# Patient Record
Sex: Male | Born: 1970
Health system: Southern US, Community
[De-identification: ages and names within clinical notes are randomized; demographics above are authoritative.]

## PROBLEM LIST (undated history)

## (undated) DIAGNOSIS — J301 Allergic rhinitis due to pollen: Secondary | ICD-10-CM

## (undated) DIAGNOSIS — L732 Hidradenitis suppurativa: Secondary | ICD-10-CM

## (undated) HISTORY — DX: Hidradenitis suppurativa: L73.2

## (undated) HISTORY — PX: NO PAST SURGERIES: SHX2092

## (undated) HISTORY — DX: Allergic rhinitis due to pollen: J30.1

---

## 1998-04-05 ENCOUNTER — Encounter: Payer: Self-pay | Admitting: Emergency Medicine

## 1998-04-05 ENCOUNTER — Emergency Department (HOSPITAL_COMMUNITY): Admission: EM | Admit: 1998-04-05 | Discharge: 1998-04-05 | Payer: Self-pay | Admitting: Emergency Medicine

## 2008-09-25 ENCOUNTER — Emergency Department (HOSPITAL_COMMUNITY): Admission: EM | Admit: 2008-09-25 | Discharge: 2008-09-26 | Payer: Self-pay | Admitting: Emergency Medicine

## 2012-10-04 ENCOUNTER — Emergency Department: Payer: Self-pay | Admitting: Emergency Medicine

## 2012-10-04 LAB — BASIC METABOLIC PANEL
Anion Gap: 3 — ABNORMAL LOW (ref 7–16)
BUN: 12 mg/dL (ref 7–18)
Calcium, Total: 9.2 mg/dL (ref 8.5–10.1)
Chloride: 105 mmol/L (ref 98–107)
Co2: 31 mmol/L (ref 21–32)
Creatinine: 0.93 mg/dL (ref 0.60–1.30)
EGFR (African American): >60
EGFR (Non-African Amer.): >60
Glucose: 87 mg/dL (ref 65–99)
Osmolality: 277 (ref 275–301)
Potassium: 4.3 mmol/L (ref 3.5–5.1)
Sodium: 139 mmol/L (ref 136–145)

## 2012-10-04 LAB — CBC WITH DIFFERENTIAL/PLATELET
Basophil #: 0.1 10*3/uL (ref 0.0–0.1)
Basophil %: 0.5 %
Eosinophil #: 0.1 10*3/uL (ref 0.0–0.7)
Eosinophil %: 1.1 %
HCT: 43.8 % (ref 40.0–52.0)
HGB: 14.1 g/dL (ref 13.0–18.0)
Lymphocyte #: 1.4 10*3/uL (ref 1.0–3.6)
Lymphocyte %: 10.4 %
MCH: 28.1 pg (ref 26.0–34.0)
MCHC: 32.2 g/dL (ref 32.0–36.0)
MCV: 87 fL (ref 80–100)
Monocyte #: 1.5 x10 3/mm — ABNORMAL HIGH (ref 0.2–1.0)
Monocyte %: 10.7 %
Neutrophil #: 10.7 10*3/uL — ABNORMAL HIGH (ref 1.4–6.5)
Neutrophil %: 77.3 %
Platelet: 335 10*3/uL (ref 150–440)
RBC: 5.01 10*6/uL (ref 4.40–5.90)
RDW: 14.9 % — ABNORMAL HIGH (ref 11.5–14.5)
WBC: 13.8 10*3/uL — ABNORMAL HIGH (ref 3.8–10.6)

## 2012-10-07 ENCOUNTER — Emergency Department: Payer: Self-pay

## 2012-11-19 ENCOUNTER — Emergency Department: Payer: Self-pay | Admitting: Emergency Medicine

## 2013-01-17 DIAGNOSIS — L732 Hidradenitis suppurativa: Secondary | ICD-10-CM

## 2015-11-05 DIAGNOSIS — F17200 Nicotine dependence, unspecified, uncomplicated: Secondary | ICD-10-CM | POA: Insufficient documentation

## 2015-11-14 ENCOUNTER — Telehealth: Payer: Self-pay

## 2015-11-14 NOTE — Telephone Encounter (Signed)
Contacted patient to see if he was still interested in scheduling a New Patient appointment with [facility] as he wasn't at the time he turned in his application and eligibility.  Attempted to call patient previously, but no luck reaching.   Was successful today and per patient, he has already established care at another practice so an appointment was no longer needed.Marland Kitchen.  He asked that we hold onto his application in [facility].  I advised him that we would hold onto it for 6 months before it would be shredded.

## 2016-05-21 ENCOUNTER — Emergency Department
Admission: EM | Admit: 2016-05-21 | Discharge: 2016-05-21 | Disposition: A | Discharge status: Home / Self Care | Payer: Self-pay | Attending: Emergency Medicine | Admitting: Emergency Medicine

## 2016-05-21 DIAGNOSIS — B349 Viral infection, unspecified: Secondary | ICD-10-CM | POA: Insufficient documentation

## 2016-05-21 DIAGNOSIS — F17200 Nicotine dependence, unspecified, uncomplicated: Secondary | ICD-10-CM | POA: Insufficient documentation

## 2016-05-21 DIAGNOSIS — L732 Hidradenitis suppurativa: Secondary | ICD-10-CM

## 2016-05-21 MED ORDER — ONDANSETRON 4 MG PO TBDP
4.0000 mg | ORAL_TABLET | Freq: Once | ORAL | Status: AC
  Administered 2016-05-21: 4 mg via ORAL
  Filled 2016-05-21: qty 1

## 2016-05-21 MED ORDER — ONDANSETRON 4 MG PO TBDP: 4.0000 mg | ORAL_TABLET | Freq: Three times a day (TID) | ORAL | 0 refills | Status: DC | PRN

## 2016-05-21 NOTE — ED Notes (Signed)
See triage note  States he developed headache on Monday evening  Then developed some body aches with some "hot flashes and cold chills" afebrile on arrival  Having some nausea today had 1 episode of vomiting

## 2016-05-21 NOTE — ED Notes (Signed)
Pt given a glass of water for PO challange

## 2016-05-21 NOTE — ED Provider Notes (Signed)
[facility] Emergency Department Provider Note  ____________________________________________  Time seen: Approximately 12:03 PM  I have reviewed the triage vital signs and the nursing notes.   HISTORY  Chief Complaint Nausea and Weakness   HPI Author L Mayford Knifeurner is a 46 y.o. male who presents to the emergency department for evalution of headache, pain, nausea, and a single episode of vomiting last night. Symptoms started yesterday. He states that he is also had some intermittent dizziness and intermittent abdominal pain. He has been able to tolerate ginger ale without vomiting, but has not tolerated any other fluids or foods. He denies fevers, cough, chills, congestion, or diarrhea. He is taking Humira for chronic hidradenitis and his injection is due today, however he has not taken it due to.    No past medical history on file.  Patient Active Problem List   Diagnosis Date Noted  . Hidradenitis suppurativa 05/21/2016    No past surgical history on file.  Prior to Admission medications   Medication Sig Start Date End Date Taking? Authorizing Provider  ondansetron (ZOFRAN-ODT) 4 MG disintegrating tablet Take 1 tablet (4 mg total) by mouth every 8 (eight) hours as needed for nausea or vomiting. 05/21/16   Chinita Pesterari B Elaiza Shoberg, FNP    Allergies Patient has no known allergies.  No family history on file.  Social History Social History  Substance Use Topics  . Smoking status: Current Every Day Smoker  . Smokeless tobacco: Never Used  . Alcohol use Yes    Review of Systems Constitutional: Negative for fever/chills ENT: Negative for sore throat. Cardiovascular: Denies chest pain. Respiratory: Negative for shortness of breath. Negative for cough. Gastrointestinal: Positive for nausea,  positive for one episode of vomiting.  No diarrhea.  Musculoskeletal: Negative for body aches Skin: Negative for rash. Neurological: Negative for  headaches ____________________________________________   PHYSICAL EXAM:  VITAL SIGNS: ED Triage Vitals  Enc Vitals Group     BP 05/21/16 1001 100/72     Pulse Rate 05/21/16 1001 98     Resp 05/21/16 1001 18     Temp 05/21/16 1001 98.6 F (37 C)     Temp Source 05/21/16 1001 Oral     SpO2 05/21/16 1001 99 %     Weight 05/21/16 1001 175 lb (79.4 kg)     Height 05/21/16 1001 6\' 1"  (1.854 m)     Head Circumference --      Peak Flow --      Pain Score 05/21/16 1005 8     Pain Loc --      Pain Edu? --      Excl. in GC? --     Constitutional: Alert and oriented. Well appearing and in no acute distress. Eyes: Conjunctivae are normal. EOMI. Nose: No sinus pain on exam, no rhinorrhea. Mouth/Throat: Mucous membranes are moist.  Oropharynx is without erythema and tonsils were not visualized. Neck: No stridor. Airway is patent Lymphatic: No palpable cervical lymphadenopathy  Cardiovascular: Normal rate, regular rhythm. Good peripheral circulation. Respiratory: Breath sounds clear to auscultation throughout. Normal respiratory effort without retractions. Gastrointestinal: Soft, nontender to palpation, no rebound or guarding. Bowel sounds hyperactive 4 quadrants. Musculoskeletal: FROM x 4 extremities.  Neurologic:  Normal speech and language.  Skin:  Skin is warm, dry and intact. No rash noted. Psychiatric: Mood and affect are normal. Speech and behavior are normal.  ____________________________________________   LABS (all labs ordered are listed, but only abnormal results are displayed)  Labs Reviewed - No data  to display ____________________________________________  EKG   ____________________________________________  RADIOLOGY   ____________________________________________   PROCEDURES  Procedure(s) performed: None  Critical Care performed: No ____________________________________________   INITIAL IMPRESSION / ASSESSMENT AND PLAN / ED COURSE  46 year old male  presenting to the emergency department for decreased appetite, 1 episode of vomiting, headache, nausea, and occasional dizziness. While in the emergency department he was given one dose of Zofran followed by a by mouth challenge. He was able to tolerate fluids and complains of feeling hungry. He was advised to withhold his dose of Humira today and give next week after his current symptoms have resolved. He was encouraged to stay on clear liquids today and then increase to bland foods as tolerated tomorrow. He was advised to follow-up with his primary care provider for any symptom that is not improving over the next 2-3 days. He was instructed to return to the emergency department for symptoms that change or worsen if he is unable schedule an appointment.  Pertinent labs & imaging results that were available during my care of the patient were reviewed by me and considered in my medical decision making (see chart for details).  Discharge Medication List as of 05/21/2016  1:11 PM    START taking these medications   Details  ondansetron (ZOFRAN-ODT) 4 MG disintegrating tablet Take 1 tablet (4 mg total) by mouth every 8 (eight) hours as needed for nausea or vomiting., Starting Wed 05/21/2016, Print        If controlled substance prescribed during this visit, 12 month history viewed on the [facility] prior to issuing an initial prescription for Schedule II or III opiod. ____________________________________________   FINAL CLINICAL IMPRESSION(S) / ED DIAGNOSES  Final diagnoses:  Viral illness    Note:  This document was prepared using Dragon voice recognition software and may include unintentional dictation errors.     Chinita Pester, FNP 05/21/16 1454    Emily Filbert, MD 05/21/16 [phone]

## 2016-05-21 NOTE — ED Triage Notes (Signed)
Pt arrives ambulatory to triage with reports of headache pain, nausea with one episode of vomiting in the past 24 hours and dizziness   Symptoms began approx 2 days ago

## 2017-02-09 DIAGNOSIS — L732 Hidradenitis suppurativa: Secondary | ICD-10-CM | POA: Diagnosis not present

## 2017-02-09 DIAGNOSIS — Z6821 Body mass index (BMI) 21.0-21.9, adult: Secondary | ICD-10-CM | POA: Diagnosis not present

## 2017-04-15 DIAGNOSIS — N5089 Other specified disorders of the male genital organs: Secondary | ICD-10-CM | POA: Diagnosis not present

## 2017-04-15 DIAGNOSIS — R05 Cough: Secondary | ICD-10-CM | POA: Diagnosis not present

## 2017-04-15 DIAGNOSIS — L732 Hidradenitis suppurativa: Secondary | ICD-10-CM | POA: Diagnosis not present

## 2017-04-15 DIAGNOSIS — Z5181 Encounter for therapeutic drug level monitoring: Secondary | ICD-10-CM | POA: Diagnosis not present

## 2017-04-15 DIAGNOSIS — Z79899 Other long term (current) drug therapy: Secondary | ICD-10-CM | POA: Diagnosis not present

## 2017-04-26 ENCOUNTER — Other Ambulatory Visit: Payer: Self-pay

## 2017-04-26 ENCOUNTER — Emergency Department
Admission: EM | Admit: 2017-04-26 | Discharge: 2017-04-27 | Disposition: A | Discharge status: Home / Self Care | Payer: BLUE CROSS/BLUE SHIELD | Attending: Emergency Medicine | Admitting: Emergency Medicine

## 2017-04-26 ENCOUNTER — Emergency Department: Payer: BLUE CROSS/BLUE SHIELD

## 2017-04-26 DIAGNOSIS — Z79899 Other long term (current) drug therapy: Secondary | ICD-10-CM | POA: Diagnosis not present

## 2017-04-26 DIAGNOSIS — R110 Nausea: Secondary | ICD-10-CM | POA: Diagnosis not present

## 2017-04-26 DIAGNOSIS — R05 Cough: Secondary | ICD-10-CM

## 2017-04-26 DIAGNOSIS — M791 Myalgia, unspecified site: Secondary | ICD-10-CM | POA: Diagnosis not present

## 2017-04-26 DIAGNOSIS — J209 Acute bronchitis, unspecified: Secondary | ICD-10-CM | POA: Diagnosis not present

## 2017-04-26 DIAGNOSIS — R059 Cough, unspecified: Secondary | ICD-10-CM

## 2017-04-26 DIAGNOSIS — F17200 Nicotine dependence, unspecified, uncomplicated: Secondary | ICD-10-CM | POA: Diagnosis not present

## 2017-04-26 NOTE — ED Notes (Signed)
Pt reports bad cough for the last 2 weeks, yellowish thick production with cough, pt within the last week began to take Remicade for suppression of hidradenitis suppurativa (pt reports regular boils in groin), pt told by doctor that this is an immuno suppressant and to come to ED if sick  Reports taking OTC meds: "mucus relief/expectorant" without relief

## 2017-04-26 NOTE — ED Notes (Signed)
Pt given a mask and instructed on mask wearing. Pt demonstrates understanding.

## 2017-04-26 NOTE — ED Triage Notes (Signed)
Pt states has had a cough for over one week. Pt states he has had a sore throat, body aches. Pt is taking remicade. Pt denies otc medication. Strong cough noted with yellow sputum production.

## 2017-04-27 ENCOUNTER — Other Ambulatory Visit: Payer: Self-pay

## 2017-04-27 LAB — CBC WITH DIFFERENTIAL/PLATELET
BASOS ABS: 0 10*3/uL (ref 0–0.1)
BASOS PCT: 0 %
EOS ABS: 0 10*3/uL (ref 0–0.7)
Eosinophils Relative: 0 %
HCT: 38.9 % — ABNORMAL LOW (ref 40.0–52.0)
Hemoglobin: 12.9 g/dL — ABNORMAL LOW (ref 13.0–18.0)
LYMPHS PCT: 16 %
Lymphs Abs: 2.9 10*3/uL (ref 1.0–3.6)
MCH: 28.8 pg (ref 26.0–34.0)
MCHC: 33.2 g/dL (ref 32.0–36.0)
MCV: 86.7 fL (ref 80.0–100.0)
MONO ABS: 2.4 10*3/uL — AB (ref 0.2–1.0)
Monocytes Relative: 13 %
NEUTROS PCT: 71 %
Neutro Abs: 13.1 10*3/uL — ABNORMAL HIGH (ref 1.4–6.5)
PLATELETS: 318 10*3/uL (ref 150–440)
RBC: 4.49 MIL/uL (ref 4.40–5.90)
RDW: 15.4 % — ABNORMAL HIGH (ref 11.5–14.5)
WBC: 18.4 10*3/uL — ABNORMAL HIGH (ref 3.8–10.6)

## 2017-04-27 LAB — LACTIC ACID, PLASMA: Lactic Acid, Venous: 0.6 mmol/L (ref 0.5–1.9)

## 2017-04-27 LAB — BASIC METABOLIC PANEL
Anion gap: 11 (ref 5–15)
BUN: 9 mg/dL (ref 6–20)
CO2: 27 mmol/L (ref 22–32)
CREATININE: 0.87 mg/dL (ref 0.61–1.24)
Calcium: 8.3 mg/dL — ABNORMAL LOW (ref 8.9–10.3)
Chloride: 101 mmol/L (ref 101–111)
Glucose, Bld: 97 mg/dL (ref 65–99)
Potassium: 3 mmol/L — ABNORMAL LOW (ref 3.5–5.1)
SODIUM: 139 mmol/L (ref 135–145)

## 2017-04-27 LAB — GROUP A STREP BY PCR: GROUP A STREP BY PCR: NOT DETECTED

## 2017-04-27 MED ORDER — DIPHENHYDRAMINE HCL 50 MG/ML IJ SOLN
INTRAMUSCULAR | Status: AC
  Administered 2017-04-27: 25 mg via INTRAVENOUS
  Filled 2017-04-27: qty 1

## 2017-04-27 MED ORDER — HYDROCOD POLST-CPM POLST ER 10-8 MG/5ML PO SUER: 5.0000 mL | Freq: Two times a day (BID) | ORAL | 0 refills | Status: DC

## 2017-04-27 MED ORDER — AMOXICILLIN 500 MG PO CAPS: 500.0000 mg | ORAL_CAPSULE | Freq: Three times a day (TID) | ORAL | 0 refills | Status: DC

## 2017-04-27 MED ORDER — AZITHROMYCIN 500 MG IV SOLR
500.0000 mg | Freq: Once | INTRAVENOUS | Status: AC
  Administered 2017-04-27: 500 mg via INTRAVENOUS
  Filled 2017-04-27: qty 500

## 2017-04-27 MED ORDER — ONDANSETRON HCL 4 MG/2ML IJ SOLN
4.0000 mg | Freq: Once | INTRAMUSCULAR | Status: AC
Start: 2017-04-27 — End: 2017-04-27
  Administered 2017-04-27: 4 mg via INTRAVENOUS
  Filled 2017-04-27: qty 2

## 2017-04-27 MED ORDER — DEXTROSE 5 % IV SOLN
1.0000 g | Freq: Once | INTRAVENOUS | Status: AC
  Administered 2017-04-27: 1 g via INTRAVENOUS
  Filled 2017-04-27: qty 10

## 2017-04-27 MED ORDER — KETOROLAC TROMETHAMINE 30 MG/ML IJ SOLN
10.0000 mg | Freq: Once | INTRAMUSCULAR | Status: AC
  Administered 2017-04-27: 9.9 mg via INTRAVENOUS
  Filled 2017-04-27: qty 1

## 2017-04-27 MED ORDER — DIPHENHYDRAMINE HCL 50 MG/ML IJ SOLN
25.0000 mg | Freq: Once | INTRAMUSCULAR | Status: AC
  Administered 2017-04-27: 25 mg via INTRAVENOUS

## 2017-04-27 MED ORDER — SODIUM CHLORIDE 0.9 % IV BOLUS (SEPSIS)
1000.0000 mL | Freq: Once | INTRAVENOUS | Status: AC
  Administered 2017-04-27: 1000 mL via INTRAVENOUS

## 2017-04-27 NOTE — ED Provider Notes (Signed)
[facility] Emergency Department Provider Note   ____________________________________________   First MD Initiated Contact with Patient 04/26/17 2359     (approximate)  I have reviewed the triage vital signs and the nursing notes.   HISTORY  Chief Complaint Cough    HPI Benjamin Owens is a 47 y.o. male who presents to the ED from home with a chief complaint of cough, sore throat, body aches.  Reports symptoms for almost 2 weeks.  Complains of cough productive of yellow sputum, sore throat, body aches and nausea.  Symptoms worse today.. Patient started Remicade infusion 1/28 for hidradenitis suppurativa. He was referred to the ED for evaluation. Denies fever, chills, chest pain, shortness of breath, abdominal pain, vomiting, dysuria, diarrhea. Denies recent travel or trauma.   Past medical history Hidradenitis suppurativa  Patient Active Problem List   Diagnosis Date Noted  . Hidradenitis suppurativa 05/21/2016      Prior to Admission medications   Medication Sig Start Date End Date Taking? Authorizing Provider  amoxicillin (AMOXIL) 500 MG capsule Take 1 capsule (500 mg total) by mouth 3 (three) times daily. 04/27/17   Irean Hong, MD  chlorpheniramine-HYDROcodone [facility] PENNKINETIC ER) 10-8 MG/5ML SUER Take 5 mLs by mouth 2 (two) times daily. 04/27/17   Irean Hong, MD  ondansetron (ZOFRAN-ODT) 4 MG disintegrating tablet Take 1 tablet (4 mg total) by mouth every 8 (eight) hours as needed for nausea or vomiting. 05/21/16   Kem Boroughs B, FNP    Allergies Azithromycin  No family history on file.  Social History Social History   Tobacco Use  . Smoking status: Current Every Day Smoker  . Smokeless tobacco: Never Used  Substance Use Topics  . Alcohol use: Yes  . Drug use: Not on file    Review of Systems  Constitutional: Positive for myalgias.  No fever/chills. Eyes: No visual changes. ENT: Positive for sore  throat. Cardiovascular: Denies chest pain. Respiratory: Positive for productive cough.  Denies shortness of breath. Gastrointestinal: No abdominal pain.  Positive for nausea, no vomiting.  No diarrhea.  No constipation. Genitourinary: Negative for dysuria. Musculoskeletal: Negative for back pain. Skin: Negative for rash. Neurological: Negative for headaches, focal weakness or numbness.   ____________________________________________   PHYSICAL EXAM:  VITAL SIGNS: ED Triage Vitals [04/26/17 2213]  Enc Vitals Group     BP (!) 144/95     Pulse Rate 90     Resp 16     Temp 98.4 F (36.9 C)     Temp Source Oral     SpO2 95 %     Weight 176 lb (79.8 kg)     Height 6' (1.829 m)     Head Circumference      Peak Flow      Pain Score 7     Pain Loc      Pain Edu?      Excl. in GC?     Constitutional: Alert and oriented. Well appearing and in no acute distress. Eyes: Conjunctivae are normal. PERRL. EOMI. Head: Atraumatic. Nose: No congestion/rhinnorhea. Mouth/Throat: Mucous membranes are moist.  Oropharynx mildly erythematous without tonsillar swelling, exudates or peritonsillar abscess.  There is no hoarse or muffled voice.  There is no drooling. Neck: No stridor.  Supple neck without meningismus. Cardiovascular: Normal rate, regular rhythm. Grossly normal heart sounds.  Good peripheral circulation. Respiratory: Normal respiratory effort.  No retractions. Lungs CTAB. Gastrointestinal: Soft and nontender. No distention. No abdominal bruits. No CVA tenderness.  Musculoskeletal: No lower extremity tenderness nor edema.  No joint effusions. Neurologic:  Normal speech and language. No gross focal neurologic deficits are appreciated. No gait instability. Skin:  Skin is warm, dry and intact. No rash noted.  No petechiae. Psychiatric: Mood and affect are normal. Speech and behavior are normal.  ____________________________________________   LABS (all labs ordered are listed, but only  abnormal results are displayed)  Labs Reviewed  CBC WITH DIFFERENTIAL/PLATELET - Abnormal; Notable for the following components:      Result Value   WBC 18.4 (*)    Hemoglobin 12.9 (*)    HCT 38.9 (*)    RDW 15.4 (*)    Neutro Abs 13.1 (*)    Monocytes Absolute 2.4 (*)    All other components within normal limits  BASIC METABOLIC PANEL - Abnormal; Notable for the following components:   Potassium 3.0 (*)    Calcium 8.3 (*)    All other components within normal limits  GROUP A STREP BY PCR  CULTURE, BLOOD (ROUTINE X 2)  CULTURE, BLOOD (ROUTINE X 2)  LACTIC ACID, PLASMA   ____________________________________________  EKG  None ____________________________________________  RADIOLOGY  ED MD interpretation: No pneumonia  Official radiology report(s): Dg Chest 2 View  Result Date: 04/26/2017 CLINICAL DATA:  Acute onset of productive cough. Sore throat and body aches. EXAM: CHEST  2 VIEW COMPARISON:  None. FINDINGS: The lungs are well-aerated and clear. There is no evidence of focal opacification, pleural effusion or pneumothorax. The heart is normal in size; ventricular calcification is suggested on the lateral view. No acute osseous abnormalities are seen. An apparent right-sided nipple shadow is noted. IMPRESSION: No acute cardiopulmonary process seen. Electronically Signed   By: Roanna Raider M.D.   On: 04/26/2017 23:02    ____________________________________________   PROCEDURES  Procedure(s) performed: None  Procedures  Critical Care performed: No  ____________________________________________   INITIAL IMPRESSION / ASSESSMENT AND PLAN / ED COURSE  As part of my medical decision making, I reviewed the following data within the electronic MEDICAL RECORD NUMBER Nursing notes reviewed and incorporated, Labs reviewed, Old chart reviewed, Radiograph reviewed  and Notes from prior ED visits.   47 year old male on IV Remicade infusions who presents with flulike symptoms  for the past 2 weeks. Differential includes, but is not limited to, viral syndrome, bronchitis including COPD exacerbation, pneumonia, reactive airway disease including asthma, CHF including exacerbation with or without pulmonary/interstitial edema, pneumothorax, ACS, thoracic trauma, and pulmonary embolism.  Patient is afebrile and generally well-appearing.  Symptoms for 2 weeks; would not qualify for Tamiflu.  Will check basic lab work, initiate IV fluid resuscitation, Toradol and reassess.  Clinical Course as of Apr 27 714  [facility] Apr 27, 2017  1610 Patient sleeping in no acute distress.  He had some itching associated with azithromycin.  This was stopped and Benadryl given.  No further itching.  Will discharge home on Amoxicillin, Tussionex to use as needed for cough.  Strict return precautions given.  Patient verbalizes understanding and agrees with plan of care.  [JS]  0705 Discharge was held as patient drove himself to the ED and received IV Benadryl.  He will be discharged once his medication hold is completed.  [JS]    Clinical Course User Index [JS] Irean Hong, MD     ____________________________________________   FINAL CLINICAL IMPRESSION(S) / ED DIAGNOSES  Final diagnoses:  Cough  Acute bronchitis, unspecified organism     ED Discharge Orders  Ordered    amoxicillin (AMOXIL) 500 MG capsule  3 times daily     04/27/17 0448    chlorpheniramine-HYDROcodone (TUSSIONEX PENNKINETIC ER) 10-8 MG/5ML SUER  2 times daily     04/27/17 0448       Note:  This document was prepared using Dragon voice recognition software and may include unintentional dictation errors.    Irean HongSung, Lakina Mcintire J, MD 04/27/17 [phone]

## 2017-04-27 NOTE — Discharge Instructions (Signed)
1.  Take antibiotic as prescribed (Amoxicillin 500 mg 3 times daily for 7 days). 2.  You may take Tussionex as needed for cough. 3.  Return to the ER for worsening symptoms, persistent vomiting, difficulty breathing or other concerns.

## 2017-04-27 NOTE — ED Notes (Signed)
Pt states he is feeling better and ready to go home. EDP aware.

## 2017-04-27 NOTE — ED Notes (Signed)
Pt reports itching up right arm, arm appears unremarkable,  ABX stopped and Dr Dolores FrameSung notified, orders received

## 2017-05-02 LAB — CULTURE, BLOOD (ROUTINE X 2)
CULTURE: NO GROWTH
Culture: NO GROWTH
Special Requests: ADEQUATE
Special Requests: ADEQUATE

## 2017-05-18 DIAGNOSIS — L732 Hidradenitis suppurativa: Secondary | ICD-10-CM | POA: Diagnosis not present

## 2017-05-20 DIAGNOSIS — L732 Hidradenitis suppurativa: Secondary | ICD-10-CM | POA: Diagnosis not present

## 2017-06-17 DIAGNOSIS — L732 Hidradenitis suppurativa: Secondary | ICD-10-CM | POA: Diagnosis not present

## 2017-08-06 DIAGNOSIS — L732 Hidradenitis suppurativa: Secondary | ICD-10-CM | POA: Diagnosis not present

## 2017-08-12 DIAGNOSIS — L732 Hidradenitis suppurativa: Secondary | ICD-10-CM | POA: Diagnosis not present

## 2017-09-16 DIAGNOSIS — L732 Hidradenitis suppurativa: Secondary | ICD-10-CM | POA: Diagnosis not present

## 2017-10-07 DIAGNOSIS — L732 Hidradenitis suppurativa: Secondary | ICD-10-CM | POA: Diagnosis not present

## 2017-10-22 DIAGNOSIS — L732 Hidradenitis suppurativa: Secondary | ICD-10-CM | POA: Diagnosis not present

## 2017-11-10 DIAGNOSIS — L732 Hidradenitis suppurativa: Secondary | ICD-10-CM | POA: Diagnosis not present

## 2017-11-10 DIAGNOSIS — Z6822 Body mass index (BMI) 22.0-22.9, adult: Secondary | ICD-10-CM | POA: Diagnosis not present

## 2017-11-24 ENCOUNTER — Other Ambulatory Visit: Payer: Self-pay

## 2017-11-24 ENCOUNTER — Emergency Department: Payer: BLUE CROSS/BLUE SHIELD

## 2017-11-24 ENCOUNTER — Emergency Department
Admission: EM | Admit: 2017-11-24 | Discharge: 2017-11-25 | Disposition: A | Discharge status: Home / Self Care | Payer: BLUE CROSS/BLUE SHIELD | Attending: Emergency Medicine | Admitting: Emergency Medicine

## 2017-11-24 ENCOUNTER — Encounter: Payer: Self-pay | Admitting: Emergency Medicine

## 2017-11-24 DIAGNOSIS — R05 Cough: Secondary | ICD-10-CM | POA: Diagnosis not present

## 2017-11-24 DIAGNOSIS — R1030 Lower abdominal pain, unspecified: Secondary | ICD-10-CM | POA: Diagnosis not present

## 2017-11-24 DIAGNOSIS — F17200 Nicotine dependence, unspecified, uncomplicated: Secondary | ICD-10-CM | POA: Diagnosis not present

## 2017-11-24 DIAGNOSIS — R945 Abnormal results of liver function studies: Secondary | ICD-10-CM

## 2017-11-24 DIAGNOSIS — N289 Disorder of kidney and ureter, unspecified: Secondary | ICD-10-CM | POA: Diagnosis not present

## 2017-11-24 DIAGNOSIS — Z79899 Other long term (current) drug therapy: Secondary | ICD-10-CM | POA: Diagnosis not present

## 2017-11-24 DIAGNOSIS — R52 Pain, unspecified: Secondary | ICD-10-CM

## 2017-11-24 DIAGNOSIS — E876 Hypokalemia: Secondary | ICD-10-CM | POA: Diagnosis not present

## 2017-11-24 DIAGNOSIS — R7989 Other specified abnormal findings of blood chemistry: Secondary | ICD-10-CM

## 2017-11-24 DIAGNOSIS — R0602 Shortness of breath: Secondary | ICD-10-CM | POA: Diagnosis not present

## 2017-11-24 DIAGNOSIS — R51 Headache: Secondary | ICD-10-CM | POA: Diagnosis not present

## 2017-11-24 LAB — COMPREHENSIVE METABOLIC PANEL
ALK PHOS: 613 U/L — AB (ref 38–126)
ALT: 72 U/L — AB (ref 0–44)
AST: 60 U/L — ABNORMAL HIGH (ref 15–41)
Albumin: 2.6 g/dL — ABNORMAL LOW (ref 3.5–5.0)
Anion gap: 11 (ref 5–15)
BUN: 7 mg/dL (ref 6–20)
CALCIUM: 8.4 mg/dL — AB (ref 8.9–10.3)
CO2: 25 mmol/L (ref 22–32)
CREATININE: 0.91 mg/dL (ref 0.61–1.24)
Chloride: 97 mmol/L — ABNORMAL LOW (ref 98–111)
GFR calc Af Amer: >60 mL/min (ref >60)
GFR calc non Af Amer: >60 mL/min (ref >60)
Glucose, Bld: 93 mg/dL (ref 70–99)
Potassium: 3.1 mmol/L — ABNORMAL LOW (ref 3.5–5.1)
SODIUM: 133 mmol/L — AB (ref 135–145)
Total Bilirubin: 3.7 mg/dL — ABNORMAL HIGH (ref 0.3–1.2)
Total Protein: 9.2 g/dL — ABNORMAL HIGH (ref 6.5–8.1)

## 2017-11-24 LAB — CBC WITH DIFFERENTIAL/PLATELET
BASOS PCT: 0 %
Basophils Absolute: 0 10*3/uL (ref 0–0.1)
EOS ABS: 0.2 10*3/uL (ref 0–0.7)
EOS PCT: 1 %
HCT: 38.8 % — ABNORMAL LOW (ref 40.0–52.0)
HEMOGLOBIN: 13.3 g/dL (ref 13.0–18.0)
Lymphocytes Relative: 19 %
Lymphs Abs: 2.2 10*3/uL (ref 1.0–3.6)
MCH: 28.5 pg (ref 26.0–34.0)
MCHC: 34.4 g/dL (ref 32.0–36.0)
MCV: 82.8 fL (ref 80.0–100.0)
MONO ABS: 1.8 10*3/uL — AB (ref 0.2–1.0)
MONOS PCT: 16 %
NEUTROS PCT: 64 %
Neutro Abs: 7.3 10*3/uL — ABNORMAL HIGH (ref 1.4–6.5)
PLATELETS: 329 10*3/uL (ref 150–440)
RBC: 4.68 MIL/uL (ref 4.40–5.90)
RDW: 17.9 % — AB (ref 11.5–14.5)
WBC: 11.6 10*3/uL — ABNORMAL HIGH (ref 3.8–10.6)

## 2017-11-24 LAB — LIPASE, BLOOD: LIPASE: 28 U/L (ref 11–51)

## 2017-11-24 MED ORDER — BUTALBITAL-APAP-CAFFEINE 50-325-40 MG PO TABS
1.0000 | ORAL_TABLET | Freq: Once | ORAL | Status: AC
Start: 2017-11-24 — End: 2017-11-24
  Administered 2017-11-24: 1 via ORAL
  Filled 2017-11-24: qty 1

## 2017-11-24 MED ORDER — IOPAMIDOL (ISOVUE-300) INJECTION 61%
100.0000 mL | Freq: Once | INTRAVENOUS | Status: AC | PRN
  Administered 2017-11-24: 100 mL via INTRAVENOUS

## 2017-11-24 MED ORDER — IBUPROFEN 800 MG PO TABS
800.0000 mg | ORAL_TABLET | Freq: Once | ORAL | Status: AC
  Administered 2017-11-24: 800 mg via ORAL
  Filled 2017-11-24: qty 1

## 2017-11-24 NOTE — ED Triage Notes (Signed)
Patient ambulatory to triage with steady gait, without difficulty or distress noted; pt reports last several days having frontal HA with nausea; denies hx of same

## 2017-11-24 NOTE — ED Provider Notes (Addendum)
-----------------------------------------   11:02 PM on 11/24/2017 -----------------------------------------   Assuming care from Dr. Lamont Snowball.  In short, Benjamin Owens is a 47 y.o. male with a chief complaint of general malaise, abd pain, nausea, headache, etc.  Refer to the original H&P for additional details.  The current plan of care is to follow up U/S and CT.  Anticipate discharge with outpatient follow up if no emergent findings on imaging.   ----------------------------------------- 12:32 AM on 11/25/2017 -----------------------------------------  Ultrasound was generally unremarkable and CT scan was as well except for the sequela of hydradenitis and a 2 cm solid mass in the mid to lower right kidney.  Radiology recommends outpatient MRI follow-up (renal protocol with contrast), and I explained this to the patient both verbally and in my written discharge instructions.  I explained that most of the time the source of masses or lesions are benign but it is very important he follow-up.  He states that he understands.  He confirmed with me that he does drink alcohol heavily and I explained that that likely accounts for the liver function test abnormalities.  However I did also strongly encouraged him to follow-up with gastroenterology and I provided the name and number with whom he can follow-up.  He says he will do that as well.  I gave my usual and customary return precautions.    Loleta Rose, MD 11/25/17 0035    ----------------------------------------- 12:36 AM on 11/25/2017 -----------------------------------------  Of note, I also added on acute hepatitis panel to help with outpatient follow up.   Loleta Rose, MD 11/25/17 [phone]

## 2017-11-24 NOTE — ED Notes (Signed)
Pt states over 3 days has had HA. Had N&V when HA started. Had a loose BM few days ago as well. States feels weak. Speaking in complete sentences. Alert, oriented, ambulatory.

## 2017-11-24 NOTE — ED Provider Notes (Signed)
[facility] Emergency Department Provider Note  ____________________________________________   First MD Initiated Contact with Patient 11/24/17 2059     (approximate)  I have reviewed the triage vital signs and the nursing notes.   HISTORY  Chief Complaint Headache   HPI Benjamin Owens is a 47 y.o. male who self presents to the emergency department with 3 days of headache, nausea, vomiting, and malaise.  His symptoms began insidiously have been slowly progressive are now mild to moderate and intermittent.  His headache is bifrontal throbbing aching.  No neck pain.  He denies fevers but he says he has had "chills" at home.  He has a past medical history of hidradenitis for which she takes Remicade every 5 weeks.  He last had Remicade 5 weeks ago.  He has had no cough.  No rhinorrhea.  He was recently tested for tuberculosis.  He said he is concerned because he was told with hidradenitis he is at risk for "an infection".  Nothing seems to make his symptoms better or worse.  He has had difficulty keeping down food secondary to nausea.    Past Medical History:  Diagnosis Date  . Hydradenitis     Patient Active Problem List   Diagnosis Date Noted  . Hidradenitis suppurativa 05/21/2016    History reviewed. No pertinent surgical history.  Prior to Admission medications   Medication Sig Start Date End Date Taking? Authorizing Provider  amoxicillin (AMOXIL) 500 MG capsule Take 1 capsule (500 mg total) by mouth 3 (three) times daily. 04/27/17   Paulette Blanch, MD  chlorpheniramine-HYDROcodone [facility] PENNKINETIC ER) 10-8 MG/5ML SUER Take 5 mLs by mouth 2 (two) times daily. 04/27/17   Paulette Blanch, MD  ondansetron (ZOFRAN-ODT) 4 MG disintegrating tablet Take 1 tablet (4 mg total) by mouth every 8 (eight) hours as needed for nausea or vomiting. 05/21/16   Sherrie George B, FNP    Allergies Azithromycin  No family history on file.  Social History Social  History   Tobacco Use  . Smoking status: Current Every Day Smoker  . Smokeless tobacco: Never Used  Substance Use Topics  . Alcohol use: Yes  . Drug use: Not on file    Review of Systems Constitutional: Positive for chills Eyes: No visual changes. ENT: No sore throat. Cardiovascular: Denies chest pain. Respiratory: Denies shortness of breath. Gastrointestinal: No abdominal pain.  Positive for nausea, positive for vomiting.  No diarrhea.  No constipation. Genitourinary: Negative for dysuria. Musculoskeletal: Negative for back pain. Skin: Positive for chronic rash Neurological: Positive for headache   ____________________________________________   PHYSICAL EXAM:  VITAL SIGNS: ED Triage Vitals [11/24/17 1935]  Enc Vitals Group     BP 119/87     Pulse Rate 94     Resp 18     Temp 98.5 F (36.9 C)     Temp Source Oral     SpO2 100 %     Weight 170 lb (77.1 kg)     Height 6' (1.829 m)     Head Circumference      Peak Flow      Pain Score 5     Pain Loc      Pain Edu?      Excl. in [place]?     Constitutional: Alert and oriented x4 pleasant cooperative speaks in full clear sentences no diaphoresis Eyes: PERRL EOMI. pupils are midrange and brisk.  He is icteric Head: Atraumatic. Nose: No congestion/rhinnorhea. Mouth/Throat: No trismus  Neck: No stridor.   Cardiovascular: Normal rate, regular rhythm. Grossly normal heart sounds.  Good peripheral circulation. Respiratory: Normal respiratory effort.  No retractions. Lungs CTAB and moving good air Gastrointestinal: Soft nondistended nontender no rebound or guarding no peritonitis Musculoskeletal: No lower extremity edema   Neurologic:  Normal speech and language. No gross focal neurologic deficits are appreciated. Skin: Hidradenitis in bilateral axilla Psychiatric: Mood and affect are normal. Speech and behavior are normal.    ____________________________________________   DIFFERENTIAL includes but not limited  to  Viral syndrome, tuberculosis, pneumonia, enteritis ____________________________________________   LABS (all labs ordered are listed, but only abnormal results are displayed)  Labs Reviewed  COMPREHENSIVE METABOLIC PANEL - Abnormal; Notable for the following components:      Result Value   Sodium 133 (*)    Potassium 3.1 (*)    Chloride 97 (*)    Calcium 8.4 (*)    Total Protein 9.2 (*)    Albumin 2.6 (*)    AST 60 (*)    ALT 72 (*)    Alkaline Phosphatase 613 (*)    Total Bilirubin 3.7 (*)    All other components within normal limits  CBC WITH DIFFERENTIAL/PLATELET - Abnormal; Notable for the following components:   WBC 11.6 (*)    HCT 38.8 (*)    RDW 17.9 (*)    Neutro Abs 7.3 (*)    Monocytes Absolute 1.8 (*)    All other components within normal limits  LIPASE, BLOOD  HEPATITIS PANEL, ACUTE    Lab work reviewed by me with a number of abnormalities most notably elevated bilirubin, alkaline phosphatase, and low albumin.  Bilirubin could be secondary to Joubert syndrome versus biliary obstruction __________________________________________  EKG   ____________________________________________  RADIOLOGY  Chest x-ray reviewed by me with no acute disease noted ____________________________________________   PROCEDURES  Procedure(s) performed: no  Procedures  Critical Care performed: no  ____________________________________________   INITIAL IMPRESSION / ASSESSMENT AND PLAN / ED COURSE  Pertinent labs & imaging results that were available during my care of the patient were reviewed by me and considered in my medical decision making (see chart for details).   As part of my medical decision making, I reviewed the following data within the [place] History obtained from family if available, nursing notes, old chart and ekg, as well as notes from prior ED visits.  The patient comes to the emergency department relatively well-appearing and  afebrile.  He is immune suppressed with Remicade which makes him a little bit more complicated than a typical enteritis versus viral syndrome.  Will get a chest x-ray basic labs and reevaluate.  Fioricet and ibuprofen for headache now.  The patient's symptoms are nearly completely resolved.  His blood work is abnormal with low albumin, high alk phos, and high bilirubin.  It is possible this could be secondary to Joubert syndrome however he could also have biliary obstruction.  CT scan and ultrasound are pending.  Care signed over to Dr. Karma Greaser will follow up on the results.  Clinical Course as of Nov 24 2336  Tue Nov 24, 2017  2324 CT Abdomen Pelvis W Contrast [CF]    Clinical Course User Index [CF] Hinda Kehr, MD     ____________________________________________   FINAL CLINICAL IMPRESSION(S) / ED DIAGNOSES  Final diagnoses:  Pain  Hyperbilirubinemia  Elevated LFTs  Hypokalemia      NEW MEDICATIONS STARTED DURING THIS VISIT:  New Prescriptions   No medications on  file     Note:  This document was prepared using Dragon voice recognition software and may include unintentional dictation errors.     Darel Hong, MD 11/24/17 [phone]

## 2017-11-25 MED ORDER — POTASSIUM CHLORIDE CRYS ER 20 MEQ PO TBCR
40.0000 meq | EXTENDED_RELEASE_TABLET | Freq: Once | ORAL | Status: AC
  Administered 2017-11-25: 40 meq via ORAL
  Filled 2017-11-25: qty 2

## 2017-11-25 MED ORDER — POTASSIUM CHLORIDE CRYS ER 20 MEQ PO TBCR: 20.0000 meq | EXTENDED_RELEASE_TABLET | Freq: Every day | ORAL | 0 refills | Status: DC

## 2017-11-25 NOTE — ED Notes (Signed)

## 2017-11-25 NOTE — Discharge Instructions (Addendum)
Your workup in the Emergency Department today was generally reassuring.  Your abnormal lab work (related to your liver) is most likely due to your alcohol consumption.  We recommend you drink plenty of fluids, take a potassium supplement as prescribed, take your regular medications and/or any new ones prescribed today, and follow up with the doctor(s) listed in these documents as recommended.  As we discussed, the CT scan did show that you have a solid appearing lesion in your right kidney that is about an inch wide.  The radiologist recommended that you discuss this with your regular doctor(s) to ask about having an outpatient renal protocol MRI with contrast for further evaluation.  Most of the time these are benign, but we strongly encourage you to discuss it with your doctor and ask them to order outpatient imaging.  The gastroenterologist with whom you follow up (Dr. Tobi Bastos or one of his colleagues) may also be able to order this MRI as an outpatient.  Return to the Emergency Department if you develop new or worsening symptoms that concern you.

## 2017-11-26 DIAGNOSIS — L732 Hidradenitis suppurativa: Secondary | ICD-10-CM | POA: Diagnosis not present

## 2017-11-26 LAB — HEPATITIS PANEL, ACUTE
HCV Ab: 0.1 s/co ratio (ref 0.0–0.9)
HEP A IGM: NEGATIVE
HEP B C IGM: NEGATIVE
Hepatitis B Surface Ag: NEGATIVE

## 2017-11-28 DIAGNOSIS — L03011 Cellulitis of right finger: Secondary | ICD-10-CM | POA: Diagnosis not present

## 2017-12-02 ENCOUNTER — Ambulatory Visit: Payer: BLUE CROSS/BLUE SHIELD | Admitting: Gastroenterology

## 2017-12-02 VITALS — BP 98/62 | HR 83 | Ht 72.0 in | Wt 165.6 lb

## 2017-12-02 DIAGNOSIS — R197 Diarrhea, unspecified: Secondary | ICD-10-CM | POA: Diagnosis not present

## 2017-12-02 DIAGNOSIS — R945 Abnormal results of liver function studies: Secondary | ICD-10-CM | POA: Diagnosis not present

## 2017-12-02 DIAGNOSIS — R7989 Other specified abnormal findings of blood chemistry: Secondary | ICD-10-CM

## 2017-12-02 NOTE — Progress Notes (Signed)
Wyline Mood MD, MRCP(U.K) [address]  [address]  [place], [place] [address]  Main: [phone]  Fax: [phone]   Gastroenterology Consultation  Referring Provider:     No ref. provider found Primary Care Physician:  Patient, No Pcp Per Primary Gastroenterologist:  Dr. Wyline Mood  Reason for Consultation:     Abnormal LFT's         HPI:   Benjamin Owens is a 47 y.o. y/o male referred  By the ER.He is here to see me as a referral from the emergency room.  He was seen at the emergency room on Monday 19 for generalized malaise, abdominal pain, nausea, headache. at the ER his liver function tests were found to be elevated and was attributed to heavy alcohol consumption.  He has a history of hidradenitis suppurativa and is on Remicade.   CT scan of the on 11/24/2017 showed severe skin thickening and soft tissue infiltration with multiple gas and cutaneous area.  Findings could be secondary to soft tissue infection.  21 mm hypodense lesion in the mid to lower right kidney.  Suggest outpatient MRI.  Multiple enlarged right inguinal lymph nodes.  He also went an ultrasound of his right upper quadrant on 11/24/2017 which again demonstrated no acute hepatobiliary abnormality but showed the same abnormal appearing area on the lower pole of the right kidney.   He underwent lab tests which were negative for hepatitis B surface antigen and hepatitis C viral antibody.  Lipase was normal.  Hemoglobin was 13.3 g with an MCV of 82.8.  CMP demonstrated an albumin of 2.6, AST of 60 ALT of 72 alkaline phosphate of 613.  Total bilirubin of 3.7.   He says that he feels "great", drinks 3 beers a day every day , a case over the weekend, has been doing so for many years.No illegal drugs use, no tattoos, no military service, no otc meds except for Bc's occasionally , no herbal medications, no blood transfusions. No family history of liver disease. Brown stool. No itching .   He has been on Rifampicin for  the past 2 months, given by his dermatologist for his hidradenitis suppurative.   Past Medical History:  Diagnosis Date  . Hydradenitis     No past surgical history on file.  Prior to Admission medications   Medication Sig Start Date End Date Taking? Authorizing Provider  amoxicillin (AMOXIL) 500 MG capsule Take 1 capsule (500 mg total) by mouth 3 (three) times daily. 04/27/17  Yes Irean Hong, MD  clindamycin (CLEOCIN) 300 MG capsule Take by mouth. 11/25/17  Yes [provider]  gabapentin (NEURONTIN) 300 MG capsule Take by mouth. 10/07/17  Yes [provider]  rifampin (RIFADIN) 300 MG capsule TAKE 1 CAPSULE (300 MG TOTAL) BY MOUTH TWO (2) TIMES A DAY. 11/02/17  Yes [provider]  chlorpheniramine-HYDROcodone (TUSSIONEX PENNKINETIC ER) 10-8 MG/5ML SUER Take 5 mLs by mouth 2 (two) times daily. Patient not taking: Reported on 12/02/2017 04/27/17   Irean Hong, MD  ondansetron (ZOFRAN-ODT) 4 MG disintegrating tablet Take 1 tablet (4 mg total) by mouth every 8 (eight) hours as needed for nausea or vomiting. Patient not taking: Reported on 12/02/2017 05/21/16   Kem Boroughs B, FNP  potassium chloride SA (KLOR-CON M20) 20 MEQ tablet Take 1 tablet (20 mEq total) by mouth daily. Patient not taking: Reported on 12/02/2017 11/25/17   Loleta Rose, MD    No family history on file.   Social History  Tobacco Use  . Smoking status: Current Every Day Smoker  . Smokeless tobacco: Never Used  Substance Use Topics  . Alcohol use: Yes  . Drug use: Not on file    Allergies as of 12/02/2017 - Review Complete 12/02/2017  Allergen Reaction Noted  . Azithromycin Itching 04/27/2017    Review of Systems:    All systems reviewed and negative except where noted in HPI.   Physical Exam:  BP 98/62   Pulse 83   Ht 6' (1.829 m)   Wt 165 lb 9.6 oz (75.1 kg)   BMI 22.46 kg/m  No LMP for male patient. Psych:  Alert and cooperative. Normal mood and affect. General:   Alert,   Well-developed, well-nourished, pleasant and cooperative in NAD Head:  Normocephalic and atraumatic. Eyes:  Sclera clear, no icterus.   Conjunctiva pink. Ears:  Normal auditory acuity. Nose:  No deformity, discharge, or lesions. Mouth:  No deformity or lesions,oropharynx pink & moist. Neck:  Supple; no masses or thyromegaly. Lungs:  Respirations even and unlabored.  Clear throughout to auscultation.   No wheezes, crackles, or rhonchi. No acute distress. Heart:  Regular rate and rhythm; no murmurs, clicks, rubs, or gallops. Abdomen:  Normal bowel sounds.  No bruits.  Soft, non-tender and non-distended without masses, hepatosplenomegaly or hernias noted.  No guarding or rebound tenderness.    Neurologic:  Alert and oriented x3;  grossly normal neurologically. Skin:  Intact without significant lesions or rashes. No jaundice. Lymph Nodes:  No significant cervical adenopathy. Psych:  Alert and cooperative. Normal mood and affect.  Imaging Studies: Dg Chest 2 View  Result Date: 11/24/2017 CLINICAL DATA:  Fever cough and short of breath EXAM: CHEST - 2 VIEW COMPARISON:  04/26/2017 FINDINGS: The heart size and mediastinal contours are within normal limits. Both lungs are clear. Degenerative changes of the spine. IMPRESSION: No active cardiopulmonary disease. Electronically Signed   By: Jasmine Pang M.D.   On: 11/24/2017 21:36   Ct Abdomen Pelvis W Contrast  Result Date: 11/24/2017 CLINICAL DATA:  Lower quadrant abdominal pain EXAM: CT ABDOMEN AND PELVIS WITH CONTRAST TECHNIQUE: Multidetector CT imaging of the abdomen and pelvis was performed using the standard protocol following bolus administration of intravenous contrast. CONTRAST:  ISOVUE-300 IOPAMIDOL (ISOVUE-300) INJECTION 61% COMPARISON:  None. FINDINGS: Lower chest: Lung bases demonstrate no acute consolidation or effusion. The heart size is within normal limits. Hepatobiliary: No focal liver abnormality is seen. No gallstones, gallbladder  wall thickening, or biliary dilatation. Pancreas: Unremarkable. No pancreatic ductal dilatation or surrounding inflammatory changes. Spleen: Normal in size without focal abnormality. Adrenals/Urinary Tract: Adrenal glands are within normal limits. No hydronephrosis. Intermediate density lesion measuring 21 mm in the mid to lower right kidney. Bladder is normal. Stomach/Bowel: Stomach is within normal limits. Appendix appears normal. No evidence of bowel wall thickening, distention, or inflammatory changes. Vascular/Lymphatic: Minimal aortic atherosclerosis. No aneurysm. Enlarged right inguinal lymph nodes measuring up to 27 x 10 mm. Reproductive: Prostate calcification. Other: No free air or free fluid. Musculoskeletal: Significant skin thickening within the inner thigh folds and the suprapubic region. Severe skin thickening with multiple gas locules within the gluteal soft tissues, incompletely visualized. Hypodensity within the gluteus maximus muscle which may reflect edema. IMPRESSION: 1. No CT evidence for acute intra-abdominal or intrapelvic abnormality. 2. Severe skin thickening and soft tissue infiltration with multiple gas and fluid locules in the gluteal subcutaneous, incompletely visualized. Findings could be secondary to soft tissue infection, recommend correlation with direct inspection. Additional  areas of marked skin thickening within the bilateral inner thigh folds and suprapubic region. In addition there is hypodensity within the gluteus maximus muscle, possibly representing edema. 3. Indeterminate 21 mm hypodense lesion in the mid to lower right kidney. When the patient is clinically stable and able to follow directions and hold their breath (preferably as an outpatient) further evaluation with dedicated abdominal MRI should be considered. 4. Multiple enlarged right inguinal lymph nodes Electronically Signed   By: Jasmine Pang M.D.   On: 11/24/2017 23:09   US Abdomen Limited Ruq  Result Date:  11/25/2017 CLINICAL DATA:  Abdominal pain for 2 days. Elevated liver function studies. EXAM: ULTRASOUND ABDOMEN LIMITED RIGHT UPPER QUADRANT COMPARISON:  CT abdomen and pelvis 11/24/2017 FINDINGS: Gallbladder: No gallstones or wall thickening visualized. No sonographic Murphy sign noted by sonographer. Common bile duct: Diameter: 2.4 mm, normal Liver: No focal lesion identified. Within normal limits in parenchymal echogenicity. Portal vein is patent on color Doppler imaging with normal direction of blood flow towards the liver. Other: Images incidentally obtained through the kidneys demonstrate a hypoechoic lesion in the lower pole right kidney measuring 2 cm maximal diameter. This lesion was also seen at CT and appears to be solid on ultrasound. Suggestion of small calcifications. Follow-up recommended with elective MRI using contrast material and renal protocol. IMPRESSION: 1. No sonographic evidence of acute hepatobiliary abnormality. 2. Solid-appearing lesion in the lower pole right kidney measuring 2 cm diameter, also seen on previous CT. Follow-up recommended with elective MRI using contrast material and renal protocol. Electronically Signed   By: Burman Nieves M.D.   On: 11/25/2017 00:06    Assessment and Plan:   Hulon L Apodaca is a 47 y.o. y/o male has been referred for abnormal LFT's. He was recently seen at the ER, h/o heavy alcohol consumption. He is also on Rifampicin for hidradenitis supuurutiva. It is likely that the combination of alcohol and Rifampicin can be causing elevation in his LFT's. Occasionally although rare , remicaid can trigger an autoimmune hepatitis which resolves with withdrawal of the drug .   Plan  1. Suggest follow-up with his primary care doctor for evaluation of enlarged lymph nodes and the lesion seen on recent CT scan. 2. Abnormal liver function tests his biochemistry suggests an obstructive pattern.  No gross abnormality seen on right upper quadrant ultrasound.   I would evaluate this further by performing autoimmune and viral hepatitis evaluation.  We will also order an MRCP to rule out any small duct biliary disease if LFT's today are not improving .  I strongly suggest him to stop all alcohol consumption.  If the pattern of liver function test elevation does not resolve the cessation of alcohol he may very well require a liver biopsy.  There is no features of portal hypertension seen on the CAT scan.  He also has a normal platelet count. 3. He will need to be set up with a primary care doctor as he has none- we will try and obtain and appointment.  4. I will send a copy of my note and his recent Liver tests to his dermatologist.    Follow up in 2 weeks   Dr Wyline Mood MD,MRCP(U.K)

## 2017-12-03 LAB — PROTIME-INR

## 2017-12-03 LAB — HEPATIC FUNCTION PANEL
ALK PHOS: 899 IU/L — AB (ref 39–117)
ALT: 57 IU/L — ABNORMAL HIGH (ref 0–44)
AST: 65 IU/L — AB (ref 0–40)
Albumin: 3.4 g/dL — ABNORMAL LOW (ref 3.5–5.5)
BILIRUBIN, DIRECT: 0.66 mg/dL — AB (ref 0.00–0.40)
Bilirubin Total: 1.3 mg/dL — ABNORMAL HIGH (ref 0.0–1.2)
Total Protein: 8.9 g/dL — ABNORMAL HIGH (ref 6.0–8.5)

## 2017-12-05 LAB — IRON,TIBC AND FERRITIN PANEL
FERRITIN: 182 ng/mL (ref 30–400)
Iron Saturation: 25 % (ref 15–55)
Iron: 63 ug/dL (ref 38–169)
Total Iron Binding Capacity: 257 ug/dL (ref 250–450)
UIBC: 194 ug/dL (ref 111–343)

## 2017-12-05 LAB — CERULOPLASMIN: CERULOPLASMIN: 54 mg/dL — AB (ref 16.0–31.0)

## 2017-12-05 LAB — IMMUNOGLOBULINS A/E/G/M, SERUM
IGG (IMMUNOGLOBIN G), SERUM: 3426 mg/dL — AB (ref 700–1600)
IgE (Immunoglobulin E), Serum: 60 IU/mL (ref 6–495)
IgM (Immunoglobulin M), Srm: 169 mg/dL (ref 20–172)

## 2017-12-05 LAB — ANTI-MICROSOMAL ANTIBODY LIVER / KIDNEY: LKM1 Ab: 1.3 Units (ref 0.0–20.0)

## 2017-12-05 LAB — MITOCHONDRIAL/SMOOTH MUSCLE AB PNL: SMOOTH MUSCLE AB: 8 U (ref 0–19)

## 2017-12-05 LAB — HEPATITIS B SURFACE ANTIGEN: Hepatitis B Surface Ag: NEGATIVE

## 2017-12-05 LAB — CELIAC DISEASE PANEL
Endomysial IgA: NEGATIVE
IGA/IMMUNOGLOBULIN A, SERUM: 728 mg/dL — AB (ref 90–386)
Transglutaminase IgA: <2 U/mL (ref 0–3)

## 2017-12-05 LAB — GAMMA GT: GGT: 628 IU/L — ABNORMAL HIGH (ref 0–65)

## 2017-12-05 LAB — HEPATITIS C ANTIBODY: HEP C VIRUS AB: 0.1 {s_co_ratio} (ref 0.0–0.9)

## 2017-12-05 LAB — HEPATITIS B SURFACE ANTIBODY,QUALITATIVE: Hep B Surface Ab, Qual: NONREACTIVE

## 2017-12-05 LAB — ANA: ANA: NEGATIVE

## 2017-12-05 LAB — PTH, INTACT AND CALCIUM
Calcium: 8.9 mg/dL (ref 8.7–10.2)
PTH: 23 pg/mL (ref 15–65)

## 2017-12-05 LAB — ALPHA-1-ANTITRYPSIN: A-1 Antitrypsin: 221 mg/dL — ABNORMAL HIGH (ref 90–200)

## 2017-12-05 LAB — HEPATITIS B E ANTIBODY: HEP B E AB: NEGATIVE

## 2017-12-05 LAB — HIV ANTIBODY (ROUTINE TESTING W REFLEX): HIV Screen 4th Generation wRfx: NONREACTIVE

## 2017-12-05 LAB — HEPATITIS B E ANTIGEN: Hep B E Ag: NEGATIVE

## 2017-12-05 LAB — HEPATITIS B CORE ANTIBODY, TOTAL: Hep B Core Total Ab: NEGATIVE

## 2017-12-05 LAB — HEPATITIS A ANTIBODY, TOTAL: Hep A Total Ab: NEGATIVE

## 2017-12-08 ENCOUNTER — Telehealth: Payer: Self-pay

## 2017-12-08 NOTE — Telephone Encounter (Signed)
-----   Message from Wyline MoodKiran Anna, MD sent at 12/03/2017 11:40 AM EDT ----- Inform  1. LFT's still rising  2. Needs PCP asap 3. Can we contact his dermatologist who started him on Rifampicin , the drug may be causing his LFT's to rise- may need to be stopped 4. Patient should absolutely stop drinking all alcohol  5. Repeat LFT's in a week

## 2017-12-08 NOTE — Telephone Encounter (Signed)
LVM for pt to return my call.

## 2017-12-14 ENCOUNTER — Other Ambulatory Visit: Payer: Self-pay

## 2017-12-14 ENCOUNTER — Telehealth: Payer: Self-pay

## 2017-12-14 DIAGNOSIS — R7989 Other specified abnormal findings of blood chemistry: Secondary | ICD-10-CM

## 2017-12-14 DIAGNOSIS — R945 Abnormal results of liver function studies: Secondary | ICD-10-CM

## 2017-12-14 NOTE — Telephone Encounter (Signed)
Called pt to inform him of lab results and Dr. Johnney KillianAnna's instructions to repeat LFTs and GGT this week. LVM to return call

## 2017-12-14 NOTE — Telephone Encounter (Signed)
Pt returned call. I have informed him of lab results and Dr. Johnney KillianAnna's instructions to repeat LFTs this week. Pt states he will visit our office for labs on 12-15-17.

## 2017-12-14 NOTE — Telephone Encounter (Signed)
-----   Message from Kiran Anna, MD sent at 12/13/2017  3:44 PM EDT ----- Benjamin Owens   1. I spoke with his dermatologist and they will stop his rifampicin  2. Please repeat LFT's and GGT this week  3. Follow up as scheduiled 

## 2017-12-14 NOTE — Telephone Encounter (Signed)
-----   Message from Wyline MoodKiran Anna, MD sent at 12/13/2017  3:44 PM EDT ----- Benjamin Owens   1. I spoke with his dermatologist and they will stop his rifampicin  2. Please repeat LFT's and GGT this week  3. Follow up as scheduiled

## 2017-12-15 ENCOUNTER — Other Ambulatory Visit: Payer: Self-pay

## 2017-12-15 DIAGNOSIS — R945 Abnormal results of liver function studies: Secondary | ICD-10-CM | POA: Diagnosis not present

## 2017-12-15 DIAGNOSIS — R7989 Other specified abnormal findings of blood chemistry: Secondary | ICD-10-CM

## 2017-12-16 LAB — HEPATIC FUNCTION PANEL
ALBUMIN: 3.2 g/dL — AB (ref 3.5–5.5)
ALT: 26 IU/L (ref 0–44)
AST: 40 IU/L (ref 0–40)
Alkaline Phosphatase: 540 IU/L — ABNORMAL HIGH (ref 39–117)
Bilirubin Total: 0.6 mg/dL (ref 0.0–1.2)
Bilirubin, Direct: 0.3 mg/dL (ref 0.00–0.40)
TOTAL PROTEIN: 8.7 g/dL — AB (ref 6.0–8.5)

## 2017-12-16 NOTE — Telephone Encounter (Signed)
Pt came to the lab today to have repeat LFT's.

## 2017-12-18 ENCOUNTER — Telehealth: Payer: Self-pay

## 2017-12-18 NOTE — Telephone Encounter (Signed)
Called pt regarding lab results and Dr. Johnney Killian instructions to recheck LFTs in 2 weeks. LVM to return call

## 2017-12-18 NOTE — Telephone Encounter (Signed)
-----   Message from Kiran Anna, MD sent at 12/16/2017 11:52 AM EDT ----- Inform improving continue to stay off all alcohol and rifampicin.  Recheck LFTs in 2 weeks. 

## 2017-12-22 NOTE — Telephone Encounter (Signed)
-----   Message from Wyline Mood, MD sent at 12/16/2017 11:52 AM EDT ----- Inform improving continue to stay off all alcohol and rifampicin.  Recheck LFTs in 2 weeks.

## 2017-12-22 NOTE — Telephone Encounter (Signed)
Called pt regarding lab results and Dr. Johnney Killian instructions. LVM to return call

## 2017-12-25 ENCOUNTER — Encounter: Payer: Self-pay | Admitting: Nurse Practitioner

## 2017-12-25 ENCOUNTER — Other Ambulatory Visit: Payer: Self-pay

## 2017-12-25 ENCOUNTER — Ambulatory Visit: Payer: BLUE CROSS/BLUE SHIELD | Admitting: Nurse Practitioner

## 2017-12-25 VITALS — BP 98/59 | HR 74 | Temp 98.2°F | Ht 72.0 in | Wt 160.4 lb

## 2017-12-25 DIAGNOSIS — Z23 Encounter for immunization: Secondary | ICD-10-CM | POA: Diagnosis not present

## 2017-12-25 DIAGNOSIS — Z716 Tobacco abuse counseling: Secondary | ICD-10-CM

## 2017-12-25 DIAGNOSIS — Z7689 Persons encountering health services in other specified circumstances: Secondary | ICD-10-CM

## 2017-12-25 NOTE — Progress Notes (Signed)
Subjective:    Patient ID: Benjamin Owens, male    DOB: April 07, 1970, 47 y.o.   MRN: [id number]  Benjamin Owens is a 47 y.o. male presenting on 12/25/2017 for [facility] last seen by PCP many years ago.  No recent PCP, only specialists for care.   Care Team:  - Dr. Vicente Males - [facility] for elevated LFT (alk phos only)  Possibly from Rifampin used to treat hydradenitis superativa - Dr. Otho Ket, Alroy Bailiff, PA-C - Dermatology [facility] for hydradenitis suppurativa - Dr. Lynnell Jude - Plastic Surgery [facility] - Dr. Elwyn Reach - Infusion Therapy [facility]   Encounter for Smoking Cessation Just started Chantix - starting dose pack currently.  This was started by [facility] plastic surgery as they have requested he quit smoking prior to surgery. Patient does have plans to quit smoking before surgery.  Admits he was not truly ready to quit, but has tried making an effort for plastic surgery as motivation. - patient just started his 2nd week tabs today.  Has not been able to cut back cigaretes so far. - He works 8 hour job and doesn't smoke while at work.  Now at 6-7 cigs per day.  Smokes when he gets up in early am, after meals (lunch, supper), after work,and when drinking beer.    Alcohol per day: weekdays 3 x 16 oz beers.  Weekend approx 2 cases over Sat/Sun = 24 per day.  Smokes more on weekends. - Currently tolerating well without any significant side effects to include depression, vivid dreams, nausea.   Past Medical History:  Diagnosis Date  . Hay fever   . Hidradenitis suppurativa    Past Surgical History:  Procedure Laterality Date  . NO PAST SURGERIES     Social History   Socioeconomic History  . Marital status: Single    Spouse name: Not on file  . Number of children: Not on file  . Years of education: Not on file  . Highest education level: Not on file  Occupational History  . Not on file  Social Needs  . Financial resource strain: Not on file  .  Food insecurity:    Worry: Not on file    Inability: Not on file  . Transportation needs:    Medical: Not on file    Non-medical: Not on file  Tobacco Use  . Smoking status: Current Every Day Smoker    Packs/day: 0.10  . Smokeless tobacco: Never Used  Substance and Sexual Activity  . Alcohol use: Yes    Alcohol/week: 68.0 standard drinks    Types: 68 Cans of beer per week    Comment: weekdays 3 x 16 oz beers; Weekend approx 2 cases over Sat/Sun.  . Drug use: Never  . Sexual activity: Not on file  Lifestyle  . Physical activity:    Days per week: Not on file    Minutes per session: Not on file  . Stress: Not on file  Relationships  . Social connections:    Talks on phone: Not on file    Gets together: Not on file    Attends religious service: Not on file    Active member of club or organization: Not on file    Attends meetings of clubs or organizations: Not on file    Relationship status: Not on file  . Intimate partner violence:    Fear of current or ex partner: Not on file    Emotionally  abused: Not on file    Physically abused: Not on file    Forced sexual activity: Not on file  Other Topics Concern  . 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    Family History  Problem Relation Age of Onset  . Stroke Mother   . Heart disease Mother        Degenerative heart disease  . Diabetes Mother   . Throat cancer Father   . Lung cancer Maternal Aunt   . Breast cancer Paternal Aunt   . Lung cancer Paternal Uncle        smoker  . Healthy Maternal Grandmother   . Heart attack Maternal Grandfather   . Breast cancer Paternal Grandmother   . Heart attack Paternal Grandfather   . Healthy Cousin   . Prostate cancer Paternal Uncle   . Breast cancer Maternal Aunt    Current Outpatient Medications on File Prior to Visit  Medication Sig  . cephALEXin (KEFLEX) 500 MG capsule Take 500 mg by mouth 3 (three) times daily.  . clindamycin  (CLEOCIN) 300 MG capsule Take by mouth.  . gabapentin (NEURONTIN) 300 MG capsule Take by mouth.  . inFLIXimab (REMICADE) 100 MG injection Inject into the vein.  . potassium chloride SA (KLOR-CON M20) 20 MEQ tablet Take 1 tablet (20 mEq total) by mouth daily.  . varenicline (CHANTIX) 0.5 MG tablet Take 0.5 mg by mouth 2 (two) times daily.   No current facility-administered medications on file prior to visit.     Review of Systems Per HPI unless specifically indicated above     Objective:    BP (!) 98/59 (BP Location: Left Arm, Patient Position: Sitting, Cuff Size: Normal)   Pulse 74   Temp 98.2 F (36.8 C) (Oral)   Ht 6' (1.829 m)   Wt 160 lb 6.4 oz (72.8 kg)   BMI 21.75 kg/m   Wt Readings from Last 3 Encounters:  12/25/17 160 lb 6.4 oz (72.8 kg)  12/02/17 165 lb 9.6 oz (75.1 kg)  11/24/17 170 lb (77.1 kg)    Physical Exam  Constitutional: He is oriented to person, place, and time. He appears well-developed and well-nourished. No distress.  HENT:  Head: Normocephalic and atraumatic.  Right Ear: External ear normal.  Left Ear: External ear normal.  Nose: Nose normal.  Mouth/Throat: Oropharynx is clear and moist. No oropharyngeal exudate.  Eyes: Pupils are equal, round, and reactive to light. EOM are normal.  Neck: Normal range of motion. Neck supple. No thyromegaly present.  Cardiovascular: Normal rate, regular rhythm, S1 normal, S2 normal, normal heart sounds and intact distal pulses.  Pulmonary/Chest: Effort normal and breath sounds normal. No stridor. No respiratory distress. He has no wheezes.  Neurological: He is alert and oriented to person, place, and time.  Skin: Skin is warm and dry. Capillary refill takes less than 2 seconds.  Groin not assessed for known HS  Psychiatric: He has a normal mood and affect. His behavior is normal. Judgment and thought content normal.  Vitals reviewed.    Results for orders placed or performed in visit on 12/15/17  Hepatic function  panel  Result Value Ref Range   Total Protein 8.7 (H) 6.0 - 8.5 g/dL   Albumin 3.2 (L) 3.5 - 5.5 g/dL   Bilirubin Total 0.6 0.0 - 1.2 mg/dL   Bilirubin, Direct 0.30 0.00 - 0.40 mg/dL   Alkaline Phosphatase 540 (H) 39 - 117 IU/L  AST 40 0 - 40 IU/L   ALT 26 0 - 44 IU/L      Assessment & Plan:   Problem List Items Addressed This Visit    None    Visit Diagnoses    Encounter for smoking cessation counseling    -  Primary Patient already actively starting plans to quit smoking.  Has not been able to cut back to date on day 8 of Chantix Starter pack.  Currently without side effects from medication.  Patient motivated by need for surgery, but not fully committed to the quitting process.  Discussion today >10 minutes specifically on counseling on risks of tobacco use, complications, treatment, smoking cessation, cessation tips, combating cravings, goals for quitting while on Chantix, continuing Chantix.  Plan: 1. Continue Chantix  If not down to 2-3 cigs per day at end of first month, will not likely continue Chantix. 2. Discussed strategies to avoid smoking after meals, with alcohol.   3. Followup 3 months and sooner prn.    Encounter to establish care     No recent PCP.  Specialist records reviewed in Epic/CareEverywhere.  Past medical, family, and surgical history reviewed w/ pt. - Recommend PPSV 23 vaccine in 2 weeks after receiving flu vaccine.     Needs flu shot     Pt < age 29.  Needs annual influenza vaccine.  On Remicade so at high risk for flu and flu complications.  Plan: 1. Administer Quad flu vaccine.   Relevant Orders   Flu Vaccine QUAD 6+ mos PF IM (Fluarix Quad PF) (Completed)       Follow up plan: Return in about 3 months (around 03/27/2018) for annual physical.  Cassell Smiles, DNP, AGPCNP-BC Adult Gerontology Primary Care Nurse Practitioner [facility] 12/25/2017, 5:56 PM

## 2017-12-25 NOTE — Patient Instructions (Addendum)
Benjamin Owens,   Thank you for coming in to clinic today.  1. Return to clinic in about 2 weeks for your pneumonia 23 vaccine.  2. You received your flu shot today. - Feel free to take an ibuprofen 200-400 mg 2-3 times per day or 500 mg tylenol for one dose if you have mild fever.  Limit tylenol with elevated liver tests.  3. If needing to continue chantix, call clinic.  GOAL is at least down to 2-3 cigarettes per day by week 3-4 on Chantix for continuing.  4. Also consider cutting back some on your alcohol intake.  You may not be ready for this while stopping smoking, but it may help you stop smoking since you also smoke when you are drinking.   Please schedule a follow-up appointment with [name], AGNP. Return in about 3 months (around 03/27/2018) for annual physical.  If you have any other questions or concerns, please feel free to call the clinic or send a message through MyChart. You may also schedule an earlier appointment if necessary.  You will receive a survey after today's visit either digitally by e-mail or paper by [employer]. Your experiences and feedback matter to [place].  Please respond so we know how we are doing as we provide care for you.   [name], DNP, AGNP-BC Adult Gerontology Nurse Practitioner [facility], [facility]   Coping with Quitting Smoking Quitting smoking is a physical and mental challenge. You will face cravings, withdrawal symptoms, and temptation. Before quitting, work with your health care provider to make a plan that can help you cope. Preparation can help you quit and keep you from giving in. How can I cope with cravings? Cravings usually last for 5-10 minutes. If you get through it, the craving will pass. Consider taking the following actions to help you cope with cravings:  Keep your mouth busy: ? Chew sugar-free gum. ? Suck on hard candies or a straw. ? Brush your teeth.  Keep your hands and body busy: ? Immediately  change to a different activity when you feel a craving. ? Squeeze or play with a ball. ? Do an activity or a hobby, like making bead jewelry, practicing needlepoint, or working with wood. ? Mix up your normal routine. ? Take a short exercise break. Go for a quick walk or run up and down stairs. ? Spend time in public places where smoking is not allowed.  Focus on doing something kind or helpful for someone else.  Call a friend or family member to talk during a craving.  Join a support group.  Call a quit line, such as 1-800-QUIT-NOW.  Talk with your health care provider about medicines that might help you cope with cravings and make quitting easier for you.  How can I deal with withdrawal symptoms? Your body may experience negative effects as it tries to get used to not having nicotine in the system. These effects are called withdrawal symptoms. They may include:  Feeling hungrier than normal.  Trouble concentrating.  Irritability.  Trouble sleeping.  Feeling depressed.  Restlessness and agitation.  Craving a cigarette.  To manage withdrawal symptoms:  Avoid places, people, and activities that trigger your cravings.  Remember why you want to quit.  Get plenty of sleep.  Avoid coffee and other caffeinated drinks. These may worsen some of your symptoms.  How can I handle social situations? Social situations can be difficult when you are quitting smoking, especially in the first  few weeks. To manage this, you can:  Avoid parties, bars, and other social situations where people might be smoking.  Avoid alcohol.  Leave right away if you have the urge to smoke.  Explain to your family and friends that you are quitting smoking. Ask for understanding and support.  Plan activities with friends or family where smoking is not an option.  What are some ways I can cope with stress? Wanting to smoke may cause stress, and stress can make you want to smoke. Find ways to  manage your stress. Relaxation techniques can help. For example:  Breathe slowly and deeply, in through your nose and out through your mouth.  Listen to soothing, relaxing music.  Talk with a family member or friend about your stress.  Light a candle.  Soak in a bath or take a shower.  Think about a peaceful place.  What are some ways I can prevent weight gain? Be aware that many people gain weight after they quit smoking. However, not everyone does. To keep from gaining weight, have a plan in place before you quit and stick to the plan after you quit. Your plan should include:  Having healthy snacks. When you have a craving, it may help to: ? Eat plain popcorn, crunchy carrots, celery, or other cut vegetables. ? Chew sugar-free gum.  Changing how you eat: ? Eat small portion sizes at meals. ? Eat 4-6 small meals throughout the day instead of 1-2 large meals a day. ? Be mindful when you eat. Do not watch television or do other things that might distract you as you eat.  Exercising regularly: ? Make time to exercise each day. If you do not have time for a long workout, do short bouts of exercise for 5-10 minutes several times a day. ? Do some form of strengthening exercise, like weight lifting, and some form of aerobic exercise, like running or swimming.  Drinking plenty of water or other low-calorie or no-calorie drinks. Drink 6-8 glasses of water daily, or as much as instructed by your health care provider.  Summary  Quitting smoking is a physical and mental challenge. You will face cravings, withdrawal symptoms, and temptation to smoke again. Preparation can help you as you go through these challenges.  You can cope with cravings by keeping your mouth busy (such as by chewing gum), keeping your body and hands busy, and making calls to family, friends, or a helpline for people who want to quit smoking.  You can cope with withdrawal symptoms by avoiding places where people  smoke, avoiding drinks with caffeine, and getting plenty of rest.  Ask your health care provider about the different ways to prevent weight gain, avoid stress, and handle social situations. This information is not intended to replace advice given to you by your health care provider. Make sure you discuss any questions you have with your health care provider. Document Released: 03/07/2016 Document Revised: 03/07/2016 Document Reviewed: 03/07/2016 Elsevier Interactive Patient Education  [employer].

## 2017-12-28 ENCOUNTER — Ambulatory Visit: Payer: BLUE CROSS/BLUE SHIELD | Admitting: Gastroenterology

## 2017-12-28 ENCOUNTER — Encounter: Payer: Self-pay | Admitting: Gastroenterology

## 2017-12-28 VITALS — BP 114/76 | HR 86 | Ht 72.0 in | Wt 164.4 lb

## 2017-12-28 DIAGNOSIS — R945 Abnormal results of liver function studies: Secondary | ICD-10-CM | POA: Diagnosis not present

## 2017-12-28 DIAGNOSIS — R935 Abnormal findings on diagnostic imaging of other abdominal regions, including retroperitoneum: Secondary | ICD-10-CM

## 2017-12-28 DIAGNOSIS — R7989 Other specified abnormal findings of blood chemistry: Secondary | ICD-10-CM

## 2017-12-28 NOTE — Progress Notes (Signed)
Benjamin Bellows MD, MRCP(U.K) [address]  [place]  [place], [place] [address]  Main: [phone]  Fax: [phone]   Primary Care Physician: Patient, No Pcp Per  Primary Gastroenterologist:  Dr. Jonathon Owens   No chief complaint on file.   HPI: Benjamin Owens is a 47 y.o. male    Summary of history : He was initially referred and seen on 12/02/17 for abnormal LFT's. Seen at the ER in 11/2017 .for generalized malaise, abdominal pain, nausea, headache. at the ER his liver function tests were found to be elevated and was attributed to heavy alcohol consumption. He has a history of hidradenitis suppurativa and is on Remicade. CT scan of the on 11/24/2017 showed severe skin thickening and soft tissue infiltration with multiple gas and cutaneous area. Findings could be secondary to soft tissue infection. 21 mm hypodense lesion in the mid to lower right kidney. Suggest outpatient MRI. Multiple enlarged right inguinal lymph nodes. He also went an ultrasound of his right upper quadrant on 11/24/2017 which again demonstrated no acute hepatobiliary abnormality but showed the same abnormal appearing area on the lower pole of the right kidney.  He underwent lab tests which were negative for hepatitis B surface antigen and hepatitis C viral antibody.  Lipase was normal.  Hemoglobin was 13.3 g with an MCV of 82.8.  CMP demonstrated an albumin of 2.6, AST of 60 ALT of 72 alkaline phosphate of 613.  Total bilirubin of 3.7.   He says that he drinks 3 beers a day every day , a case over the weekend, has been doing so for many years.No illegal drugs use, no tattoos, no military service, no otc meds except for Bc's occasionally , no herbal medications, no blood transfusions. No family history of liver disease. Brown stool. No itching .   He had been on Rifampicin for the past 2 months, given by his dermatologist for his hidradenitis suppurative.    Interval history   12/02/2017-  12/28/2017   LFT's  are improving after I discussed with his dermatologist and stopped his Rifampicin.   Hepatic Function Latest Ref Rng & Units 12/15/2017 12/02/2017 11/24/2017  Total Protein 6.0 - 8.5 g/dL 8.7(H) 8.9(H) 9.2(H)  Albumin 3.5 - 5.5 g/dL 3.2(L) 3.4(L) 2.6(L)  AST 0 - 40 IU/L 40 65(H) 60(H)  ALT 0 - 44 IU/L 26 57(H) 72(H)  Alk Phosphatase 39 - 117 IU/L 540(H) 899(H) 613(H)  Total Bilirubin 0.0 - 1.2 mg/dL 0.6 1.3(H) 3.7(H)  Bilirubin, Direct 0.00 - 0.40 mg/dL 0.30 0.66(H) -   Hepatitis , autoimmune screen was negative. Has not stopped drinking alcohol. Stopped Rifampicin .   He has got a primary care doctor in [place] [facility]   Current Outpatient Medications  Medication Sig Dispense Refill  . cephALEXin (KEFLEX) 500 MG capsule Take 500 mg by mouth 3 (three) times daily.    . clindamycin (CLEOCIN) 300 MG capsule Take by mouth.    . gabapentin (NEURONTIN) 300 MG capsule Take by mouth.    . inFLIXimab (REMICADE) 100 MG injection Inject into the vein.    . potassium chloride SA (KLOR-CON M20) 20 MEQ tablet Take 1 tablet (20 mEq total) by mouth daily. 7 tablet 0  . varenicline (CHANTIX) 0.5 MG tablet Take 0.5 mg by mouth 2 (two) times daily.     No current facility-administered medications for this visit.     Allergies as of 12/28/2017 - Review Complete 12/25/2017  Allergen Reaction Noted  . Azithromycin  Itching 04/27/2017    ROS:  General: Negative for anorexia, weight loss, fever, chills, fatigue, weakness. ENT: Negative for hoarseness, difficulty swallowing , nasal congestion. CV: Negative for chest pain, angina, palpitations, dyspnea on exertion, peripheral edema.  Respiratory: Negative for dyspnea at rest, dyspnea on exertion, cough, sputum, wheezing.  GI: See history of present illness. GU:  Negative for dysuria, hematuria, urinary incontinence, urinary frequency, nocturnal urination.  Endo: Negative for unusual weight change.    Physical Examination:   There were no  vitals taken for this visit.  General: Well-nourished, well-developed in no acute distress.  Eyes: No icterus. Conjunctivae pink. Mouth: Oropharyngeal mucosa moist and pink , no lesions erythema or exudate. Lungs: Clear to auscultation bilaterally. Non-labored. Heart: Regular rate and rhythm, no murmurs rubs or gallops.  Abdomen: Bowel sounds are normal, nontender, nondistended, no hepatosplenomegaly or masses, no abdominal bruits or hernia , no rebound or guarding.   Extremities: No lower extremity edema. No clubbing or deformities. Neuro: Alert and oriented x 3.  Grossly intact. Skin: Warm and dry, no jaundice.   Psych: Alert and cooperative, normal mood and affect.   Imaging Studies: No results found.  Assessment and Plan:   [name] is a 47 y.o. y/o male here to follow up f for abnormal LFT's. He was recently seen at the ER in 11/2017 , h/o heavy alcohol consumption. He was  also on Rifampicin for hidradenitis supuurutiva. IBery  likely that the combination of alcohol and Rifampicin can be causing elevation in his LFT's. Actively drinking alcohol presently   Plan   1. Recheck CMP today as LFT's are declining of Rifampicin 2. Strongly suggest to stop all alcohol as it can cause worsening of his liver function  3.  Suggest follow-up with his primary care doctor for evaluation of enlarged lymph nodes and the lesion seen on recent CT scan.Will refer to urology    Dr Benjamin Bellows  MD,MRCP [facility]) Follow up in 4 weeks

## 2017-12-29 ENCOUNTER — Other Ambulatory Visit: Payer: Self-pay

## 2017-12-29 DIAGNOSIS — R935 Abnormal findings on diagnostic imaging of other abdominal regions, including retroperitoneum: Secondary | ICD-10-CM

## 2017-12-29 LAB — COMPREHENSIVE METABOLIC PANEL
ALBUMIN: 3.1 g/dL — AB (ref 3.5–5.5)
ALK PHOS: 436 IU/L — AB (ref 39–117)
ALT: 26 IU/L (ref 0–44)
AST: 29 IU/L (ref 0–40)
Albumin/Globulin Ratio: 0.6 — ABNORMAL LOW (ref 1.2–2.2)
BUN / CREAT RATIO: 10 (ref 9–20)
BUN: 8 mg/dL (ref 6–24)
Bilirubin Total: 0.5 mg/dL (ref 0.0–1.2)
CO2: 22 mmol/L (ref 20–29)
Calcium: 8.4 mg/dL — ABNORMAL LOW (ref 8.7–10.2)
Chloride: 101 mmol/L (ref 96–106)
Creatinine, Ser: 0.79 mg/dL (ref 0.76–1.27)
GFR calc non Af Amer: 106 mL/min/{1.73_m2} (ref >59)
GFR, EST AFRICAN AMERICAN: 123 mL/min/{1.73_m2} (ref >59)
GLOBULIN, TOTAL: 5.3 g/dL — AB (ref 1.5–4.5)
Glucose: 123 mg/dL — ABNORMAL HIGH (ref 65–99)
Potassium: 3.7 mmol/L (ref 3.5–5.2)
SODIUM: 137 mmol/L (ref 134–144)
TOTAL PROTEIN: 8.4 g/dL (ref 6.0–8.5)

## 2017-12-31 DIAGNOSIS — L732 Hidradenitis suppurativa: Secondary | ICD-10-CM | POA: Diagnosis not present

## 2018-01-08 ENCOUNTER — Ambulatory Visit (INDEPENDENT_AMBULATORY_CARE_PROVIDER_SITE_OTHER): Payer: BLUE CROSS/BLUE SHIELD

## 2018-01-08 DIAGNOSIS — Z23 Encounter for immunization: Secondary | ICD-10-CM

## 2018-01-20 ENCOUNTER — Ambulatory Visit: Payer: BLUE CROSS/BLUE SHIELD | Admitting: Urology

## 2018-01-20 ENCOUNTER — Encounter: Payer: Self-pay | Admitting: Urology

## 2018-02-04 DIAGNOSIS — L732 Hidradenitis suppurativa: Secondary | ICD-10-CM | POA: Diagnosis not present

## 2018-03-11 DIAGNOSIS — L732 Hidradenitis suppurativa: Secondary | ICD-10-CM | POA: Diagnosis not present

## 2018-03-26 ENCOUNTER — Encounter: Payer: Self-pay | Admitting: Nurse Practitioner

## 2018-03-26 ENCOUNTER — Ambulatory Visit (INDEPENDENT_AMBULATORY_CARE_PROVIDER_SITE_OTHER): Payer: BLUE CROSS/BLUE SHIELD | Admitting: Nurse Practitioner

## 2018-03-26 VITALS — BP 127/84 | HR 81 | Temp 98.2°F | Ht 72.0 in | Wt 176.0 lb

## 2018-03-26 DIAGNOSIS — Z716 Tobacco abuse counseling: Secondary | ICD-10-CM | POA: Diagnosis not present

## 2018-03-26 DIAGNOSIS — Z0000 Encounter for general adult medical examination without abnormal findings: Secondary | ICD-10-CM

## 2018-03-26 MED ORDER — VARENICLINE TARTRATE 0.5 MG X 11 & 1 MG X 42 PO MISC: ORAL | 0 refills | Status: AC

## 2018-03-26 MED ORDER — VARENICLINE TARTRATE 1 MG PO TABS: 1.0000 mg | ORAL_TABLET | Freq: Two times a day (BID) | ORAL | 1 refills | Status: DC

## 2018-03-26 NOTE — Patient Instructions (Addendum)
Benjamin Owens,   Thank you for coming in to clinic today.  1. For smoking cessation, we are going to start chantix.   Start varenicline (Chantix) 7 days BEFORE your quit date.    START Chantix on: 1/5 or 03/29/2017  - Day 1-3: Take one 0.5mg  tablet once daily WITH FOOD - Days 4-7: increase to one 0.5mg  tablet twice per day.  - Days 7- 12 weeks max: Increase to one 1mg  tablet twice per day for up to 12 weeks  To quit smoking:  - Only start this treatment if you are mentally ready to quit. - Start Chantix 1 week before your quit date.  If unable to quit completely in 7 days, then work to reduce by 50% or more by 4 weeks, then another 50% reduction in 4 more weeks, and lastly quit after a final 4 weeks.  - We can continue Chantix for up to 12 weeks total if needed for maintenance. - Common side effects include nausea (take with food), headaches, insomnia, vivid dreams, mood instability, seizure, and a rare risk of suicidal ideation.  If you have any serious agitation or acute depression with severe mood changes you need to stop taking the medicine immediately   2. You will be due for FASTING BLOOD WORK.  This means you should eat no food or drink after 2 am and no labs until at least 10 am.  Lab is open 8-11:45.  Schedule the day you will come so the lab expects you.  Drink only water or coffee without cream/sugar on the morning of your lab visit. - Please go ahead and schedule a "Lab Only" visit in the morning at the clinic for lab draw in the next 7 days. - Your results will be available about 2-3 days after blood draw.  If you have set up a MyChart account, you can can log in to MyChart online to view your results and a brief explanation. Also, we can discuss your results together at your next office visit if you would like.  Please schedule a follow-up appointment with [name], AGNP. Return in about 12 weeks (around 06/18/2018) for smoking cessation AND 1 year annual physical.  If  you have any other questions or concerns, please feel free to call the clinic or send a message through MyChart. You may also schedule an earlier appointment if necessary.  You will receive a survey after today's visit either digitally by e-mail or paper by [employer]. Your experiences and feedback matter to [place].  Please respond so we know how we are doing as we provide care for you.   [name], DNP, AGNP-BC Adult Gerontology Nurse Practitioner [facility], [facility]

## 2018-03-26 NOTE — Progress Notes (Signed)
Subjective:    Patient ID: Benjamin Owens, male    DOB: [birth year], 48 y.o.   MRN: [id number]  Benjamin Owens is a 48 y.o. male presenting on 03/26/2018 for Annual Exam   HPI Annual Physical Exam Patient has been feeling well.  They have no acute concerns today. Sleeps 7-8 hours per night uninterrupted.  HEALTH MAINTENANCE: Weight/BMI: stable (175 typically for patient) Physical activity: regularly at work, sedentary at home Diet: generally healthy, regular meals out at lunch for work, carries lunch 2-3 times per week Seatbelt: always Sunscreen: not regularly Prostate exam/PSA: patient declines today - desires to wait to USPSTF guideline age of 48.  No current symptoms of dysuria Colon Cancer Screen: defers to age 49 HIV/HEP C: completed and neg Optometry: done summer 2019 Dentistry: not regular  VACCINES: Tetanus: declines today:  Influenza: done Pneumonia: 2019 Shingles: recommended for age 48  Smoking: patient was taking Chantix.  He has not completely stopped smoking.  He had cut his smoking in half on Chantix.  Only took for 4 weeks.  He reportes no depression.  Did have nausea and "crazy dreams."  Did not disrupt sleep.   Alcohol: no change   Past Medical History:  Diagnosis Date  . Hay fever   . Hidradenitis suppurativa    Past Surgical History:  Procedure Laterality Date  . NO PAST SURGERIES     Social History   Socioeconomic History  . Marital status: Single    Spouse name: Not on file  . Number of children: Not on file  . Years of education: Not on file  . Highest education level: Not on file  Occupational History  . Not on file  Social Needs  . Financial resource strain: Not on file  . Food insecurity:    Worry: Not on file    Inability: Not on file  . Transportation needs:    Medical: Not on file    Non-medical: Not on file  Tobacco Use  . Smoking status: Current Every Day Smoker    Packs/day: 0.50  . Smokeless tobacco: Never Used    Substance and Sexual Activity  . Alcohol use: Yes    Alcohol/week: 3.0 standard drinks    Types: 3 Cans of beer per week    Comment: weekdays 3 x 16 oz beers; Weekend approx 2 cases over Sat/Sun.  . Drug use: Never  . Sexual activity: Not on file  Lifestyle  . Physical activity:    Days per week: Not on file    Minutes per session: Not on file  . Stress: Not on file  Relationships  . Social connections:    Talks on phone: Not on file    Gets together: Not on file    Attends religious service: Not on file    Active member of club or organization: Not on file    Attends meetings of clubs or organizations: Not on file    Relationship status: Not on file  . Intimate partner violence:    Fear of current or ex partner: Not on file    Emotionally abused: Not on file    Physically abused: Not on file    Forced sexual activity: Not on file  Other Topics Concern  . 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    Family History  Problem Relation Age of Onset  . Stroke Mother   . Heart disease Mother  Degenerative heart disease  . Diabetes Mother   . Throat cancer Father   . Lung cancer Maternal Aunt   . Breast cancer Paternal Aunt   . Lung cancer Paternal Uncle        smoker  . Healthy Maternal Grandmother   . Heart attack Maternal Grandfather   . Breast cancer Paternal Grandmother   . Heart attack Paternal Grandfather   . Healthy Cousin   . Prostate cancer Paternal Uncle   . Breast cancer Maternal Aunt    Current Outpatient Medications on File Prior to Visit  Medication Sig  . clindamycin (CLEOCIN) 300 MG capsule Take by mouth.  . gabapentin (NEURONTIN) 300 MG capsule Take by mouth.  . inFLIXimab (REMICADE) 100 MG injection Inject into the vein.  . potassium chloride SA (KLOR-CON M20) 20 MEQ tablet Take 1 tablet (20 mEq total) by mouth daily. (Patient not taking: Reported on 03/26/2018)  . varenicline (CHANTIX) 0.5 MG tablet Take  0.5 mg by mouth 2 (two) times daily.   No current facility-administered medications on file prior to visit.     Review of Systems Per HPI unless specifically indicated above     Objective:    BP 127/84 (BP Location: Right Arm, Patient Position: Sitting, Cuff Size: Normal)   Pulse 81   Temp 98.2 F (36.8 C) (Oral)   Ht 6' (1.829 m)   Wt 176 lb (79.8 kg)   BMI 23.87 kg/m   Wt Readings from Last 3 Encounters:  03/26/18 176 lb (79.8 kg)  12/28/17 164 lb 6.4 oz (74.6 kg)  12/25/17 160 lb 6.4 oz (72.8 kg)    Physical Exam Vitals signs and nursing note reviewed.  Constitutional:      General: He is not in acute distress.    Appearance: He is well-developed.  HENT:     Head: Normocephalic and atraumatic.     Right Ear: Tympanic membrane, ear canal and external ear normal.     Left Ear: Tympanic membrane, ear canal and external ear normal.     Nose: Nose normal.     Mouth/Throat:     Mouth: Mucous membranes are moist.     Pharynx: Oropharynx is clear.  Eyes:     Conjunctiva/sclera: Conjunctivae normal.     Pupils: Pupils are equal, round, and reactive to light.  Neck:     Musculoskeletal: Normal range of motion and neck supple.     Thyroid: No thyromegaly.     Vascular: No JVD.     Trachea: No tracheal deviation.  Cardiovascular:     Rate and Rhythm: Normal rate and regular rhythm.     Heart sounds: Normal heart sounds. No murmur. No friction rub. No gallop.   Pulmonary:     Effort: Pulmonary effort is normal. No respiratory distress.     Breath sounds: Normal breath sounds.  Abdominal:     General: Abdomen is flat. Bowel sounds are normal. There is no distension.     Palpations: Abdomen is soft.     Tenderness: There is no abdominal tenderness.  Musculoskeletal: Normal range of motion.  Lymphadenopathy:     Cervical: No cervical adenopathy.  Skin:    General: Skin is warm and dry.     Capillary Refill: Capillary refill takes less than 2 seconds.  Neurological:      General: No focal deficit present.     Mental Status: He is alert and oriented to person, place, and time. Mental status is at baseline.  Cranial Nerves: No cranial nerve deficit.  Psychiatric:        Mood and Affect: Mood normal.        Behavior: Behavior normal.        Thought Content: Thought content normal.        Judgment: Judgment normal.     Results for orders placed or performed in visit on 12/28/17  Comprehensive Metabolic Panel (CMET)  Result Value Ref Range   Glucose 123 (H) 65 - 99 mg/dL   BUN 8 6 - 24 mg/dL   Creatinine, Ser 1.610.79 0.76 - 1.27 mg/dL   GFR calc non Af Amer 106 >59 mL/min/1.73   GFR calc Af Amer 123 >59 mL/min/1.73   BUN/Creatinine Ratio 10 9 - 20   Sodium 137 134 - 144 mmol/L   Potassium 3.7 3.5 - 5.2 mmol/L   Chloride 101 96 - 106 mmol/L   CO2 22 20 - 29 mmol/L   Calcium 8.4 (L) 8.7 - 10.2 mg/dL   Total Protein 8.4 6.0 - 8.5 g/dL   Albumin 3.1 (L) 3.5 - 5.5 g/dL   Globulin, Total 5.3 (H) 1.5 - 4.5 g/dL   Albumin/Globulin Ratio 0.6 (L) 1.2 - 2.2   Bilirubin Total 0.5 0.0 - 1.2 mg/dL   Alkaline Phosphatase 436 (H) 39 - 117 IU/L   AST 29 0 - 40 IU/L   ALT 26 0 - 44 IU/L      Assessment & Plan:   Problem List Items Addressed This Visit    None    Visit Diagnoses    Encounter for smoking cessation counseling    -  Primary Patient is current smoker, continues to desire quitting.  Had partial success with one month Chantix last quarter 2019.  Desires to repeat Chantix.  Previously tolerated well with few side effects other than mild nausea and vivid dreams.  Denies any prior depression/SI while on Chantix.  Plan: 1. 7 days prior to established quit date, START Chantix starting pack.  Take 0.5 mg tablet once daily w/ food for 3 days.  Take 0.5 mg tablet twice daily w/ food for 3 days.  Then, take 1 mg tablet twice daily and continue for total treatment w/ Chantix of 4-12 weeks.  Stop smoking or reduce by half on quit date. - Reviewed side effects  of nausea, vivid dreams, depression, possible SI.  2. Discussion today >5 minutes (<10 minutes) specifically on counseling on risks of tobacco use, complications, treatment, smoking cessation. 3. Follow-up 12 weeks for smoking cessation and sooner prn.   Relevant Medications   varenicline (CHANTIX STARTING MONTH PAK) 0.5 MG X 11 & 1 MG X 42 tablet   Encounter for annual physical exam     Annual physical exam with no new findings.  Well adult with no acute concerns.  Plan: 1. Obtain health maintenance screenings as above according to age. - Increase physical activity to 30 minutes most days of the week.  - Eat healthy diet high in vegetables and fruits; low in refined carbohydrates. - Labs as ordered - return to clinic in next 7 days for collection. 2. Return 1 year for annual physical.     Relevant Orders   Hemoglobin A1c   CBC with Differential/Platelet   COMPLETE METABOLIC PANEL WITH GFR   Lipid panel   TSH      Meds ordered this encounter  Medications  . varenicline (CHANTIX STARTING MONTH PAK) 0.5 MG X 11 & 1 MG X 42 tablet  Sig: Take one 0.5 mg tab by mouth once daily for 3 days, then take one 0.5 mg tab twice daily for 4 days, then take one 1 mg tab twice daily.    Dispense:  53 tablet    Refill:  0    Order Specific Question:   Supervising Provider    Answer:   Smitty Cords [[id number]]  . DISCONTD: varenicline (CHANTIX CONTINUING MONTH PAK) 1 MG tablet    Sig: Take 1 tablet (1 mg total) by mouth 2 (two) times daily.    Dispense:  60 tablet    Refill:  1    Order Specific Question:   Supervising Provider    Answer:   Smitty Cords [[id number]]     Follow up plan: Return in about 12 weeks (around 06/18/2018) for smoking cessation AND 1 year annual physical.  Benjamin Mcardle, DNP, AGPCNP-BC Adult Gerontology Primary Care Nurse Practitioner [facility] 03/26/2018, 3:09 PM

## 2018-03-28 ENCOUNTER — Other Ambulatory Visit: Payer: Self-pay

## 2018-03-28 ENCOUNTER — Encounter: Payer: Self-pay | Admitting: Emergency Medicine

## 2018-03-28 ENCOUNTER — Emergency Department: Payer: BLUE CROSS/BLUE SHIELD

## 2018-03-28 DIAGNOSIS — D899 Disorder involving the immune mechanism, unspecified: Secondary | ICD-10-CM | POA: Diagnosis not present

## 2018-03-28 DIAGNOSIS — L732 Hidradenitis suppurativa: Secondary | ICD-10-CM | POA: Diagnosis not present

## 2018-03-28 DIAGNOSIS — N179 Acute kidney failure, unspecified: Secondary | ICD-10-CM | POA: Diagnosis not present

## 2018-03-28 DIAGNOSIS — R509 Fever, unspecified: Secondary | ICD-10-CM | POA: Diagnosis not present

## 2018-03-28 DIAGNOSIS — F1020 Alcohol dependence, uncomplicated: Secondary | ICD-10-CM | POA: Diagnosis present

## 2018-03-28 DIAGNOSIS — F1290 Cannabis use, unspecified, uncomplicated: Secondary | ICD-10-CM | POA: Diagnosis present

## 2018-03-28 DIAGNOSIS — Z888 Allergy status to other drugs, medicaments and biological substances status: Secondary | ICD-10-CM

## 2018-03-28 DIAGNOSIS — I76 Septic arterial embolism: Secondary | ICD-10-CM | POA: Diagnosis not present

## 2018-03-28 DIAGNOSIS — Z801 Family history of malignant neoplasm of trachea, bronchus and lung: Secondary | ICD-10-CM | POA: Diagnosis not present

## 2018-03-28 DIAGNOSIS — R918 Other nonspecific abnormal finding of lung field: Secondary | ICD-10-CM | POA: Diagnosis not present

## 2018-03-28 DIAGNOSIS — J181 Lobar pneumonia, unspecified organism: Secondary | ICD-10-CM | POA: Diagnosis present

## 2018-03-28 DIAGNOSIS — Z792 Long term (current) use of antibiotics: Secondary | ICD-10-CM | POA: Diagnosis not present

## 2018-03-28 DIAGNOSIS — E876 Hypokalemia: Secondary | ICD-10-CM | POA: Diagnosis not present

## 2018-03-28 DIAGNOSIS — E8809 Other disorders of plasma-protein metabolism, not elsewhere classified: Secondary | ICD-10-CM | POA: Diagnosis present

## 2018-03-28 DIAGNOSIS — R945 Abnormal results of liver function studies: Secondary | ICD-10-CM | POA: Diagnosis not present

## 2018-03-28 DIAGNOSIS — R0789 Other chest pain: Secondary | ICD-10-CM | POA: Diagnosis not present

## 2018-03-28 DIAGNOSIS — F17210 Nicotine dependence, cigarettes, uncomplicated: Secondary | ICD-10-CM | POA: Diagnosis present

## 2018-03-28 DIAGNOSIS — R7989 Other specified abnormal findings of blood chemistry: Secondary | ICD-10-CM | POA: Diagnosis not present

## 2018-03-28 DIAGNOSIS — R079 Chest pain, unspecified: Secondary | ICD-10-CM | POA: Diagnosis not present

## 2018-03-28 DIAGNOSIS — A419 Sepsis, unspecified organism: Principal | ICD-10-CM | POA: Diagnosis present

## 2018-03-28 DIAGNOSIS — I361 Nonrheumatic tricuspid (valve) insufficiency: Secondary | ICD-10-CM | POA: Diagnosis not present

## 2018-03-28 DIAGNOSIS — R0781 Pleurodynia: Secondary | ICD-10-CM | POA: Diagnosis not present

## 2018-03-28 DIAGNOSIS — Z881 Allergy status to other antibiotic agents status: Secondary | ICD-10-CM | POA: Diagnosis not present

## 2018-03-28 DIAGNOSIS — Z79899 Other long term (current) drug therapy: Secondary | ICD-10-CM | POA: Diagnosis not present

## 2018-03-28 DIAGNOSIS — D72829 Elevated white blood cell count, unspecified: Secondary | ICD-10-CM | POA: Diagnosis not present

## 2018-03-28 LAB — CBC
HCT: 40.9 % (ref 39.0–52.0)
Hemoglobin: 13.4 g/dL (ref 13.0–17.0)
MCH: 27 pg (ref 26.0–34.0)
MCHC: 32.8 g/dL (ref 30.0–36.0)
MCV: 82.5 fL (ref 80.0–100.0)
PLATELETS: 346 10*3/uL (ref 150–400)
RBC: 4.96 MIL/uL (ref 4.22–5.81)
RDW: 15.9 % — AB (ref 11.5–15.5)
WBC: 11.7 10*3/uL — AB (ref 4.0–10.5)
nRBC: 0 % (ref 0.0–0.2)

## 2018-03-28 LAB — BASIC METABOLIC PANEL
Anion gap: 6 (ref 5–15)
BUN: 11 mg/dL (ref 6–20)
CO2: 27 mmol/L (ref 22–32)
CREATININE: 0.92 mg/dL (ref 0.61–1.24)
Calcium: 8.3 mg/dL — ABNORMAL LOW (ref 8.9–10.3)
Chloride: 102 mmol/L (ref 98–111)
GFR calc Af Amer: >60 mL/min (ref >60)
GFR calc non Af Amer: >60 mL/min (ref >60)
Glucose, Bld: 85 mg/dL (ref 70–99)
Potassium: 3.5 mmol/L (ref 3.5–5.1)
SODIUM: 135 mmol/L (ref 135–145)

## 2018-03-28 LAB — TROPONIN I: Troponin I: <0.03 ng/mL (ref <0.03)

## 2018-03-28 NOTE — ED Triage Notes (Addendum)
Pt reports since Friday he's having sharp pain in left chest with movement and deep breathing; pt says with deep inspiration he gets pain to lower outer right and left rib areas;  pain radiates around left armpit area; now with pain also to upper abd; denies shortness of breath; denies N/V; denies diaphoresis; denies cardiac history; pt ambulatory with steady gait; talking in complete coherent sentences; was seen by a provider on the 3rd-no testing done but was told he was not having a heart attack

## 2018-03-29 ENCOUNTER — Emergency Department: Payer: BLUE CROSS/BLUE SHIELD

## 2018-03-29 ENCOUNTER — Other Ambulatory Visit: Payer: Self-pay

## 2018-03-29 ENCOUNTER — Inpatient Hospital Stay
Admission: EM | Admit: 2018-03-29 | Discharge: 2018-04-02 | DRG: 871 | Disposition: A | Discharge status: Home / Self Care | Payer: BLUE CROSS/BLUE SHIELD | Attending: Internal Medicine | Admitting: Internal Medicine

## 2018-03-29 ENCOUNTER — Encounter: Payer: Self-pay | Admitting: Radiology

## 2018-03-29 DIAGNOSIS — L732 Hidradenitis suppurativa: Secondary | ICD-10-CM | POA: Diagnosis not present

## 2018-03-29 DIAGNOSIS — R079 Chest pain, unspecified: Secondary | ICD-10-CM | POA: Diagnosis not present

## 2018-03-29 DIAGNOSIS — R509 Fever, unspecified: Secondary | ICD-10-CM | POA: Diagnosis not present

## 2018-03-29 DIAGNOSIS — I76 Septic arterial embolism: Secondary | ICD-10-CM

## 2018-03-29 DIAGNOSIS — E8809 Other disorders of plasma-protein metabolism, not elsewhere classified: Secondary | ICD-10-CM | POA: Diagnosis present

## 2018-03-29 DIAGNOSIS — F1020 Alcohol dependence, uncomplicated: Secondary | ICD-10-CM | POA: Diagnosis not present

## 2018-03-29 DIAGNOSIS — I361 Nonrheumatic tricuspid (valve) insufficiency: Secondary | ICD-10-CM | POA: Diagnosis not present

## 2018-03-29 DIAGNOSIS — A419 Sepsis, unspecified organism: Secondary | ICD-10-CM | POA: Diagnosis present

## 2018-03-29 DIAGNOSIS — Z792 Long term (current) use of antibiotics: Secondary | ICD-10-CM | POA: Diagnosis not present

## 2018-03-29 DIAGNOSIS — F1290 Cannabis use, unspecified, uncomplicated: Secondary | ICD-10-CM | POA: Diagnosis present

## 2018-03-29 DIAGNOSIS — R918 Other nonspecific abnormal finding of lung field: Secondary | ICD-10-CM | POA: Diagnosis not present

## 2018-03-29 DIAGNOSIS — F17210 Nicotine dependence, cigarettes, uncomplicated: Secondary | ICD-10-CM | POA: Diagnosis present

## 2018-03-29 DIAGNOSIS — D72829 Elevated white blood cell count, unspecified: Secondary | ICD-10-CM | POA: Diagnosis not present

## 2018-03-29 DIAGNOSIS — R0789 Other chest pain: Secondary | ICD-10-CM | POA: Diagnosis not present

## 2018-03-29 DIAGNOSIS — D899 Disorder involving the immune mechanism, unspecified: Secondary | ICD-10-CM | POA: Diagnosis present

## 2018-03-29 DIAGNOSIS — E876 Hypokalemia: Secondary | ICD-10-CM | POA: Diagnosis not present

## 2018-03-29 DIAGNOSIS — Z79899 Other long term (current) drug therapy: Secondary | ICD-10-CM | POA: Diagnosis not present

## 2018-03-29 DIAGNOSIS — Z881 Allergy status to other antibiotic agents status: Secondary | ICD-10-CM | POA: Diagnosis not present

## 2018-03-29 DIAGNOSIS — R0781 Pleurodynia: Secondary | ICD-10-CM | POA: Diagnosis not present

## 2018-03-29 DIAGNOSIS — Z888 Allergy status to other drugs, medicaments and biological substances status: Secondary | ICD-10-CM | POA: Diagnosis not present

## 2018-03-29 DIAGNOSIS — R7989 Other specified abnormal findings of blood chemistry: Secondary | ICD-10-CM | POA: Diagnosis not present

## 2018-03-29 DIAGNOSIS — J181 Lobar pneumonia, unspecified organism: Secondary | ICD-10-CM | POA: Diagnosis present

## 2018-03-29 DIAGNOSIS — Z801 Family history of malignant neoplasm of trachea, bronchus and lung: Secondary | ICD-10-CM | POA: Diagnosis not present

## 2018-03-29 DIAGNOSIS — N179 Acute kidney failure, unspecified: Secondary | ICD-10-CM | POA: Diagnosis not present

## 2018-03-29 DIAGNOSIS — R945 Abnormal results of liver function studies: Secondary | ICD-10-CM | POA: Diagnosis present

## 2018-03-29 LAB — HEPATIC FUNCTION PANEL
ALT: 16 U/L (ref 0–44)
AST: 29 U/L (ref 15–41)
Albumin: 3.1 g/dL — ABNORMAL LOW (ref 3.5–5.0)
Alkaline Phosphatase: 213 U/L — ABNORMAL HIGH (ref 38–126)
Bilirubin, Direct: 0.1 mg/dL (ref 0.0–0.2)
Indirect Bilirubin: 0.7 mg/dL (ref 0.3–0.9)
Total Bilirubin: 0.8 mg/dL (ref 0.3–1.2)
Total Protein: 9.4 g/dL — ABNORMAL HIGH (ref 6.5–8.1)

## 2018-03-29 LAB — LIPASE, BLOOD: Lipase: 38 U/L (ref 11–51)

## 2018-03-29 LAB — PHOSPHORUS: Phosphorus: 2.5 mg/dL (ref 2.5–4.6)

## 2018-03-29 LAB — MAGNESIUM: Magnesium: 1.8 mg/dL (ref 1.7–2.4)

## 2018-03-29 LAB — PROTIME-INR
INR: 1.15
Prothrombin Time: 14.6 seconds (ref 11.4–15.2)

## 2018-03-29 LAB — APTT: aPTT: 42 seconds — ABNORMAL HIGH (ref 24–36)

## 2018-03-29 LAB — PREALBUMIN: Prealbumin: 9.8 mg/dL — ABNORMAL LOW (ref 18–38)

## 2018-03-29 LAB — LACTIC ACID, PLASMA: Lactic Acid, Venous: 0.7 mmol/L (ref 0.5–1.9)

## 2018-03-29 LAB — PROCALCITONIN: Procalcitonin: 0.59 ng/mL

## 2018-03-29 MED ORDER — LORAZEPAM 2 MG/ML IJ SOLN
1.0000 mg | Freq: Four times a day (QID) | INTRAMUSCULAR | Status: AC | PRN
  Filled 2018-03-29: qty 1

## 2018-03-29 MED ORDER — ADULT MULTIVITAMIN W/MINERALS CH
1.0000 | ORAL_TABLET | Freq: Every day | ORAL | Status: DC
  Administered 2018-03-29 – 2018-04-02 (×5): 1 via ORAL
  Filled 2018-03-29 (×5): qty 1

## 2018-03-29 MED ORDER — LORAZEPAM 1 MG PO TABS
1.0000 mg | ORAL_TABLET | Freq: Four times a day (QID) | ORAL | Status: AC | PRN
  Administered 2018-03-30 – 2018-03-31 (×3): 1 mg via ORAL
  Filled 2018-03-29 (×2): qty 1

## 2018-03-29 MED ORDER — VANCOMYCIN HCL 10 G IV SOLR
1500.0000 mg | Freq: Once | INTRAVENOUS | Status: DC
  Filled 2018-03-29: qty 1500

## 2018-03-29 MED ORDER — LORAZEPAM 2 MG/ML IJ SOLN: 0.0000 mg | Freq: Four times a day (QID) | INTRAMUSCULAR | Status: AC

## 2018-03-29 MED ORDER — IOHEXOL 350 MG/ML SOLN
75.0000 mL | Freq: Once | INTRAVENOUS | Status: AC | PRN
  Administered 2018-03-29: 100 mL via INTRAVENOUS

## 2018-03-29 MED ORDER — ONDANSETRON HCL 4 MG PO TABS: 4.0000 mg | ORAL_TABLET | Freq: Four times a day (QID) | ORAL | Status: DC | PRN

## 2018-03-29 MED ORDER — VITAMIN B-1 100 MG PO TABS
100.0000 mg | ORAL_TABLET | Freq: Every day | ORAL | Status: DC
  Administered 2018-03-30 – 2018-04-02 (×4): 100 mg via ORAL
  Filled 2018-03-29 (×4): qty 1

## 2018-03-29 MED ORDER — MORPHINE SULFATE (PF) 2 MG/ML IV SOLN
2.0000 mg | INTRAVENOUS | Status: DC | PRN
  Administered 2018-03-29 – 2018-03-31 (×6): 2 mg via INTRAVENOUS
  Filled 2018-03-29 (×6): qty 1

## 2018-03-29 MED ORDER — ONDANSETRON HCL 4 MG/2ML IJ SOLN
4.0000 mg | Freq: Four times a day (QID) | INTRAMUSCULAR | Status: DC | PRN
  Administered 2018-03-30: 4 mg via INTRAVENOUS
  Filled 2018-03-29: qty 2

## 2018-03-29 MED ORDER — DIPHENHYDRAMINE HCL 50 MG/ML IJ SOLN
25.0000 mg | Freq: Once | INTRAMUSCULAR | Status: AC
  Administered 2018-03-29: 25 mg via INTRAVENOUS

## 2018-03-29 MED ORDER — VANCOMYCIN HCL IN DEXTROSE 1-5 GM/200ML-% IV SOLN
1000.0000 mg | Freq: Three times a day (TID) | INTRAVENOUS | Status: DC
  Administered 2018-03-29 – 2018-04-01 (×9): 1000 mg via INTRAVENOUS
  Filled 2018-03-29 (×11): qty 200

## 2018-03-29 MED ORDER — MORPHINE SULFATE (PF) 4 MG/ML IV SOLN
4.0000 mg | Freq: Once | INTRAVENOUS | Status: AC
  Administered 2018-03-29: 4 mg via INTRAVENOUS
  Filled 2018-03-29: qty 1

## 2018-03-29 MED ORDER — DIPHENHYDRAMINE HCL 50 MG/ML IJ SOLN
INTRAMUSCULAR | Status: AC
  Administered 2018-03-29: 25 mg via INTRAVENOUS
  Filled 2018-03-29: qty 1

## 2018-03-29 MED ORDER — DIPHENHYDRAMINE HCL 50 MG/ML IJ SOLN
25.0000 mg | Freq: Three times a day (TID) | INTRAMUSCULAR | Status: DC
  Administered 2018-03-29 – 2018-03-31 (×6): 25 mg via INTRAVENOUS
  Filled 2018-03-29 (×6): qty 1

## 2018-03-29 MED ORDER — BISACODYL 5 MG PO TBEC
5.0000 mg | DELAYED_RELEASE_TABLET | Freq: Every day | ORAL | Status: DC | PRN
  Filled 2018-03-29: qty 1

## 2018-03-29 MED ORDER — MORPHINE SULFATE (PF) 4 MG/ML IV SOLN
4.0000 mg | INTRAVENOUS | Status: DC | PRN
  Administered 2018-03-29 (×2): 4 mg via INTRAVENOUS
  Filled 2018-03-29 (×2): qty 1

## 2018-03-29 MED ORDER — SODIUM CHLORIDE 0.9 % IV BOLUS
1000.0000 mL | Freq: Once | INTRAVENOUS | Status: AC
  Administered 2018-03-29: 1000 mL via INTRAVENOUS

## 2018-03-29 MED ORDER — ENOXAPARIN SODIUM 40 MG/0.4ML ~~LOC~~ SOLN
40.0000 mg | SUBCUTANEOUS | Status: DC
  Administered 2018-03-29 – 2018-04-02 (×5): 40 mg via SUBCUTANEOUS
  Filled 2018-03-29 (×5): qty 0.4

## 2018-03-29 MED ORDER — SENNOSIDES-DOCUSATE SODIUM 8.6-50 MG PO TABS: 1.0000 | ORAL_TABLET | Freq: Every evening | ORAL | Status: DC | PRN

## 2018-03-29 MED ORDER — FOLIC ACID 1 MG PO TABS
1.0000 mg | ORAL_TABLET | Freq: Every day | ORAL | Status: DC
  Administered 2018-03-29 – 2018-04-02 (×5): 1 mg via ORAL
  Filled 2018-03-29 (×5): qty 1

## 2018-03-29 MED ORDER — LORAZEPAM 2 MG/ML IJ SOLN: 0.0000 mg | Freq: Two times a day (BID) | INTRAMUSCULAR | Status: AC

## 2018-03-29 MED ORDER — THIAMINE HCL 100 MG/ML IJ SOLN
100.0000 mg | Freq: Every day | INTRAMUSCULAR | Status: DC
  Administered 2018-03-29: 100 mg via INTRAVENOUS
  Filled 2018-03-29: qty 2

## 2018-03-29 MED ORDER — KETOROLAC TROMETHAMINE 30 MG/ML IJ SOLN
15.0000 mg | Freq: Once | INTRAMUSCULAR | Status: AC
  Administered 2018-03-29: 15 mg via INTRAVENOUS
  Filled 2018-03-29: qty 1

## 2018-03-29 NOTE — Progress Notes (Signed)
Pharmacy Antibiotic Note  Benjamin Owens is a 48 y.o. male admitted on 03/29/2018 with pneumonia.  Pharmacy has been consulted for vancomycin dosing. Patient w/ h/o IV drug abuse arrives for CP and abdominal pain. Patient also appears to have some splintering on finger nails and possible janeway lesions concerning for bacteremia/endocarditis. Patient received vanc 1.5g IV x 1 in ED.  Plan: Will continue vanc 1g IV q8h w/ 6 hour stack  Will draw trough 01/07 @ 0400 prior to 4th dose.  Ke 0.0938 T1/2 8 hrs  Goal trough 15 - 20 mcg/mL  Height: 6' (182.9 cm) Weight: 176 lb (79.8 kg) IBW/kg (Calculated) : 77.6  Temp (24hrs), Avg:98.2 F (36.8 C), Min:98.2 F (36.8 C), Max:98.2 F (36.8 C)  Recent Labs  Lab 03/28/18 2316 03/29/18 0342  WBC 11.7*  --   CREATININE 0.92  --   LATICACIDVEN  --  0.7    Estimated Creatinine Clearance: 107.8 mL/min (by C-G formula based on SCr of 0.92 mg/dL).    Allergies  Allergen Reactions  . Azithromycin Itching    Pt arm itching after receiving IV Zithromax   Thank you for allowing pharmacy to be a part of this patient's care.  Thomasene Ripple, PharmD, BCPS Clinical Pharmacist 03/29/2018

## 2018-03-29 NOTE — Progress Notes (Signed)
Admitted early this morning.  Seen and examined by me later in the morning.  Patient states he is doing okay.  He denies any additional chest pain.  He endorses some mild shortness of breath and cough.  He adamantly denies any history of IV drug use.  States that he just smokes marijuana.  On exam, he is well-appearing. Lungs are CTAB. Normal work of breathing.  -Echo pending -Follow-up blood cultures -Continue empiric antibiotics -Monitor CIWA -Will consider ID consult and TEE pending blood culture result.  Willadean Carol, MD [facility]

## 2018-03-29 NOTE — ED Provider Notes (Addendum)
[facility] Emergency Department Provider Note  ____________________________________________   First MD Initiated Contact with Patient 03/29/18 0115     (approximate)  I have reviewed the triage vital signs and the nursing notes.   HISTORY  Chief Complaint Chest Pain and Abdominal Pain   HPI Benjamin Owens is a 48 y.o. male who self presents to the emergency department with sharp left-sided chest pain and shortness of breath along with sweating and subjective fever and chills at home that began about 4 days ago.  His symptoms came on gradually and have been constant.  They are worse with exertion and only minimally improved with rest.  His pain is clearly worse when taking a deep breath and somewhat improved when not.  2 days ago he saw his primary care nurse practitioner who told him that he was not having a heart attack however the patient remains concerned.  He denies history of IV drug use.  He denies abdominal pain nausea or vomiting.  He is an alcoholic and continues to drink daily.  He has a history of hidradenitis suppurativa and recently stopped taking his rifampin as it was causing an elevation in his LFTs and he preferred to continue drinking alcohol.   Past Medical History:  Diagnosis Date  . Hay fever   . Hidradenitis suppurativa     Patient Active Problem List   Diagnosis Date Noted  . Chest pain 03/29/2018  . Nicotine dependence, uncomplicated 11/05/2015  . Hidradenitis suppurativa 01/17/2013    Past Surgical History:  Procedure Laterality Date  . NO PAST SURGERIES      Prior to Admission medications   Medication Sig Start Date End Date Taking? Authorizing Provider  clindamycin (CLEOCIN) 300 MG capsule Take 300 mg by mouth 2 (two) times daily.  11/25/17  Yes [provider]  gabapentin (NEURONTIN) 300 MG capsule Take 300 mg by mouth as needed.  10/07/17  Yes [provider]  inFLIXimab (REMICADE) 100 MG injection  Inject into the vein.   Yes [provider]  varenicline (CHANTIX STARTING MONTH PAK) 0.5 MG X 11 & 1 MG X 42 tablet Take one 0.5 mg tab by mouth once daily for 3 days, then take one 0.5 mg tab twice daily for 4 days, then take one 1 mg tab twice daily. 03/26/18 04/26/18 Yes Galen Manila, NP    Allergies Vancomycin and Azithromycin  Family History  Problem Relation Age of Onset  . Stroke Mother   . Heart disease Mother        Degenerative heart disease  . Diabetes Mother   . Throat cancer Father   . Lung cancer Maternal Aunt   . Breast cancer Paternal Aunt   . Lung cancer Paternal Uncle        smoker  . Healthy Maternal Grandmother   . Heart attack Maternal Grandfather   . Breast cancer Paternal Grandmother   . Heart attack Paternal Grandfather   . Healthy Cousin   . Prostate cancer Paternal Uncle   . Breast cancer Maternal Aunt     Social History Social History   Tobacco Use  . Smoking status: Current Every Day Smoker    Packs/day: 0.50  . Smokeless tobacco: Never Used  Substance Use Topics  . Alcohol use: Yes    Alcohol/week: 3.0 standard drinks    Types: 3 Cans of beer per week    Comment: weekdays 3 x 16 oz beers; Weekend approx 2 cases over Sat/Sun.  Marland Kitchen  Drug use: Never    Review of Systems Constitutional: No fever/chills Eyes: No visual changes. ENT: No sore throat. Cardiovascular: Positive for chest pain. Respiratory: Positive for shortness of breath. Gastrointestinal: No abdominal pain.  No nausea, no vomiting.  No diarrhea.  No constipation. Genitourinary: Negative for dysuria. Musculoskeletal: Negative for back pain. Skin: Negative for rash. Neurological: Negative for headaches, focal weakness or numbness.   ____________________________________________   PHYSICAL EXAM:  VITAL SIGNS: ED Triage Vitals  Enc Vitals Group     BP 03/28/18 2309 (!) 131/93     Pulse Rate 03/28/18 2309 71     Resp 03/28/18 2309 17     Temp 03/28/18 2309  98.2 F (36.8 C)     Temp src --      SpO2 03/28/18 2309 100 %     Weight 03/28/18 2306 176 lb (79.8 kg)     Height 03/28/18 2306 6' (1.829 m)     Head Circumference --      Peak Flow --      Pain Score 03/28/18 2306 9     Pain Loc --      Pain Edu? --      Excl. in GC? --     Constitutional: Alert and oriented x4 appears obviously uncomfortable though nontoxic no diaphoresis and speaks in full clear sentences Eyes: PERRL EOMI. Head: Atraumatic. Nose: No congestion/rhinnorhea. Mouth/Throat: No trismus Neck: No stridor.   Cardiovascular: Normal rate, regular rhythm.  3 out of 6 systolic murmur best heard at right sternal border Focally tender over left anterior chest wall Respiratory: Normal respiratory effort.  No retractions. Lungs CTAB and moving good air Gastrointestinal: Soft nontender Musculoskeletal: No lower extremity edema   Neurologic:  Normal speech and language. No gross focal neurologic deficits are appreciated. Skin: Splinter hemorrhages and Janeway lesion as below.  No zoster rash appreciated Psychiatric: Mood and affect are normal. Speech and behavior are normal.        ____________________________________________   DIFFERENTIAL includes but not limited to  Pulmonary embolism, aortic dissection, pancreatitis, pneumonia, pneumothorax ____________________________________________   LABS (all labs ordered are listed, but only abnormal results are displayed)  Labs Reviewed  BASIC METABOLIC PANEL - Abnormal; Notable for the following components:      Result Value   Calcium 8.3 (*)    All other components within normal limits  CBC - Abnormal; Notable for the following components:   WBC 11.7 (*)    RDW 15.9 (*)    All other components within normal limits  HEPATIC FUNCTION PANEL - Abnormal; Notable for the following components:   Total Protein 9.4 (*)    Albumin 3.1 (*)    Alkaline Phosphatase 213 (*)    All other components within normal limits    PREALBUMIN - Abnormal; Notable for the following components:   Prealbumin 9.8 (*)    All other components within normal limits  APTT - Abnormal; Notable for the following components:   aPTT 42 (*)    All other components within normal limits  BASIC METABOLIC PANEL - Abnormal; Notable for the following components:   Sodium 133 (*)    Potassium 2.8 (*)    Glucose, Bld 133 (*)    Calcium 7.9 (*)    All other components within normal limits  CBC - Abnormal; Notable for the following components:   WBC 21.0 (*)    Hemoglobin 12.5 (*)    HCT 37.4 (*)    RDW 15.7 (*)  All other components within normal limits  MAGNESIUM - Abnormal; Notable for the following components:   Magnesium 1.4 (*)    All other components within normal limits  BASIC METABOLIC PANEL - Abnormal; Notable for the following components:   Sodium 132 (*)    CO2 21 (*)    Calcium 7.9 (*)    All other components within normal limits  CBC - Abnormal; Notable for the following components:   WBC 25.3 (*)    Hemoglobin 11.8 (*)    HCT 35.8 (*)    RDW 15.8 (*)    All other components within normal limits  CBC - Abnormal; Notable for the following components:   WBC 15.4 (*)    Hemoglobin 12.1 (*)    HCT 36.4 (*)    All other components within normal limits  BASIC METABOLIC PANEL - Abnormal; Notable for the following components:   Sodium 131 (*)    Glucose, Bld 109 (*)    Calcium 8.0 (*)    All other components within normal limits  CBC - Abnormal; Notable for the following components:   WBC 11.1 (*)    Hemoglobin 12.8 (*)    HCT 38.2 (*)    All other components within normal limits  CULTURE, BLOOD (ROUTINE X 2)  CULTURE, BLOOD (ROUTINE X 2)  CULTURE, BLOOD (SINGLE)  MRSA PCR SCREENING  TROPONIN I  LIPASE, BLOOD  LACTIC ACID, PLASMA  PROCALCITONIN  MAGNESIUM  PHOSPHORUS  CALCIUM, IONIZED  PROTIME-INR  VANCOMYCIN, TROUGH  MAGNESIUM  INFLUENZA PANEL BY PCR (TYPE A & B)  VANCOMYCIN, TROUGH  FUNGITELL,  SERUM  FUNGAL ANTIBODIES PANEL, ID-BLOOD  LEGIONELLA PNEUMOPHILA SEROGP 1 UR AG  CRYPTOCOCCUS ANTIGEN, SERUM  HIV ANTIBODY (ROUTINE TESTING W REFLEX)  TOXOPLASMA ANTIBODIES- IGG AND  IGM  CREATININE, SERUM  POTASSIUM    Lab work reviewed by me shows slightly elevated white count which is nonspecific __________________________________________  EKG  ED ECG REPORT I, Merrily BrittleNeil Sabirin Baray, the attending physician, personally viewed and interpreted this ECG.  Date: 03/29/2018 EKG Time:  Rate: 66 Rhythm: normal sinus rhythm QRS Axis: normal Intervals: normal ST/T Wave abnormalities: normal Narrative Interpretation: no evidence of acute ischemia  ____________________________________________  RADIOLOGY  Chest x-ray reviewed by me concerning for pneumonia CT angios the chest reviewed by me with no evidence of pulmonary embolism but is concerning for septic emboli ____________________________________________   PROCEDURES  Procedure(s) performed: no  .Critical Care Performed by: Merrily Brittleifenbark, Ryane Canavan, MD Authorized by: Merrily Brittleifenbark, Shequilla Goodgame, MD   Critical care provider statement:    Critical care time (minutes):  30   Critical care time was exclusive of:  Separately billable procedures and treating other patients   Critical care was necessary to treat or prevent imminent or life-threatening deterioration of the following conditions:  Sepsis   Critical care was time spent personally by me on the following activities:  Development of treatment plan with patient or surrogate, discussions with consultants, evaluation of patient's response to treatment, examination of patient, obtaining history from patient or surrogate, ordering and performing treatments and interventions, ordering and review of laboratory studies, ordering and review of radiographic studies, pulse oximetry, re-evaluation of patient's condition and review of old charts    Critical Care performed:  yes  ____________________________________________   INITIAL IMPRESSION / ASSESSMENT AND PLAN / ED COURSE  Pertinent labs & imaging results that were available during my care of the patient were reviewed by me and considered in my medical decision making (see chart for details).  As part of my medical decision making, I reviewed the following data within the electronic MEDICAL RECORD NUMBER History obtained from family if available, nursing notes, old chart and ekg, as well as notes from prior ED visits.  The patient comes to the emergency department quite uncomfortable appearing with pleuritic left-sided chest pain.  I will give him 15 mg of IV Toradol and CT his chest looking for pulmonary embolism.    Patient CT scan is negative for acute pulmonary embolism however is suggestive of septic emboli.  I went in and reevaluated the patient and he does have a systolic heart murmur.  He does have a splinter hemorrhage and what appears to be a Janeway lesion on his hand as well please see photos above.  The patient denies IV drug use he denies recent dental work.  Regardless given his CT scan and his clinical findings I will send 3 blood cultures and place the patient on vancomycin and he requires inpatient admission for echocardiogram and for cultures to come back to evaluate for endocarditis.  ____________________________________________ ----------------------------------------- 4:04 AM on 03/29/2018 -----------------------------------------  The patient's pain persist despite Toradol so give him 4 mg of IV morphine in addition to his antibiotics.  FINAL CLINICAL IMPRESSION(S) / ED DIAGNOSES  Final diagnoses:  Septic embolism (HCC)  Pleuritic chest pain      NEW MEDICATIONS STARTED DURING THIS VISIT:  Discharge Medication List as of 04/02/2018 10:49 AM    START taking these medications   Details  levofloxacin (LEVAQUIN) 750 MG tablet Take 1 tablet (750 mg total) by mouth daily for 5  days., Starting Fri 04/02/2018, Until Wed 04/07/2018, Normal         Note:  This document was prepared using Dragon voice recognition software and may include unintentional dictation errors.    Merrily Brittleifenbark, Ronita Hargreaves, MD 03/29/18 [phone]    Merrily Brittleifenbark, Kellen Hover, MD 03/29/18 [phone]    Merrily Brittleifenbark, Eulla Kochanowski, MD 04/08/18 [phone]

## 2018-03-29 NOTE — ED Notes (Addendum)
Pt reports itching at right iv, site appears red, admitting doc notified, orders received, vancomycin stopped

## 2018-03-29 NOTE — H&P (Signed)
[employer] - [place] [facility]   PATIENT NAME: Benjamin Owens    MR#:  [id number]  DATE OF BIRTH:  10-19-1970  DATE OF ADMISSION:  03/29/2018  PRIMARY CARE PHYSICIAN: Galen Manila, NP   REQUESTING/REFERRING PHYSICIAN: Merrily Brittle, MD  CHIEF COMPLAINT:   Chief Complaint  Patient presents with  . Chest Pain  . Abdominal Pain    HISTORY OF PRESENT ILLNESS:  Benjamin Owens  is a 48 y.o. male with a known history of hidradenitis suppurativa (Remicade) p/w CP. States he developed CP on Thursday 03/25/2018. L-sided CP, initially dull, constant but worse w/ movement/exertion. He states that he woke up on Friday (03/26/2018) morning and noticed the pain to be significantly worse. He also endorses an episode of diaphoresis that day. Saturday (03/27/2018) morning, the pain began to radiate to the L upper chest/ribs and around to his L upper back. On Sunday (03/28/2018) evening, he states he began to have R-sided CP in addition to the prior L-sided CP, w/ severity increasing through the night. He states the pain ultimately became "excruciating", and he is unable to take a deep breath w/o severe discomfort. He denies cough. He states he drove himself to the ED. He is a 1/2ppd smoker x72yrs. He states he drinks 3x 16oz beer per weeknight (to help him sleep), but drinks 1.5 to 2 cases (36-48 beers) per weekend. He states, "smoke(s) weed," but denies IV drug use. He has good dentition, w/ no obvious dental caries, gingival lesions or abscesses. Has not had a colonoscopy yet (age 62).  CTA chest (+) "Bilateral multilobar pulmonary opacities, one with possible faint cavitation in the lingula raise concern for possible septic emboli." Denies IVDA. Has good dentition. States he was tested for TB prior to being started on Humira (in past) and Remicade.  PAST MEDICAL HISTORY:   Past Medical History:  Diagnosis Date  . Hay fever   . Hidradenitis suppurativa     PAST  SURGICAL HISTORY:   Past Surgical History:  Procedure Laterality Date  . NO PAST SURGERIES      SOCIAL HISTORY:   Social History   Tobacco Use  . Smoking status: Current Every Day Smoker    Packs/day: 0.50  . Smokeless tobacco: Never Used  Substance Use Topics  . Alcohol use: Yes    Alcohol/week: 3.0 standard drinks    Types: 3 Cans of beer per week    Comment: weekdays 3 x 16 oz beers; Weekend approx 2 cases over Sat/Sun.    FAMILY HISTORY:   Family History  Problem Relation Age of Onset  . Stroke Mother   . Heart disease Mother        Degenerative heart disease  . Diabetes Mother   . Throat cancer Father   . Lung cancer Maternal Aunt   . Breast cancer Paternal Aunt   . Lung cancer Paternal Uncle        smoker  . Healthy Maternal Grandmother   . Heart attack Maternal Grandfather   . Breast cancer Paternal Grandmother   . Heart attack Paternal Grandfather   . Healthy Cousin   . Prostate cancer Paternal Uncle   . Breast cancer Maternal Aunt     DRUG ALLERGIES:   Allergies  Allergen Reactions  . Azithromycin Itching    Pt arm itching after receiving IV Zithromax    REVIEW OF SYSTEMS:   Review of Systems  Constitutional: Positive for malaise/fatigue. Negative for chills, diaphoresis, fever and weight  loss.  HENT: Negative for congestion, ear pain, hearing loss, nosebleeds, sinus pain, sore throat and tinnitus.   Eyes: Negative for blurred vision, double vision and photophobia.  Respiratory: Positive for shortness of breath. Negative for cough, hemoptysis, sputum production and wheezing.   Cardiovascular: Positive for chest pain. Negative for palpitations, orthopnea, claudication, leg swelling and PND.  Gastrointestinal: Negative for abdominal pain, blood in stool, constipation, diarrhea, heartburn, melena, nausea and vomiting.  Genitourinary: Negative for dysuria, frequency, hematuria and urgency.  Musculoskeletal: Positive for back pain. Negative for joint  pain, myalgias and neck pain.  Skin: Negative for itching and rash.  Neurological: Negative for dizziness, tingling, tremors, sensory change, speech change, focal weakness, seizures, loss of consciousness, weakness and headaches.  Psychiatric/Behavioral: Positive for substance abuse. Negative for depression and memory loss. The patient is not nervous/anxious and does not have insomnia.    MEDICATIONS AT HOME:   Prior to Admission medications   Medication Sig Start Date End Date Taking? Authorizing Provider  clindamycin (CLEOCIN) 300 MG capsule Take 300 mg by mouth 3 (three) times daily.  11/25/17  Yes [provider]  gabapentin (NEURONTIN) 300 MG capsule Take 300 mg by mouth as needed.  10/07/17  Yes [provider]  inFLIXimab (REMICADE) 100 MG injection Inject into the vein.   Yes [provider]  varenicline (CHANTIX STARTING MONTH PAK) 0.5 MG X 11 & 1 MG X 42 tablet Take one 0.5 mg tab by mouth once daily for 3 days, then take one 0.5 mg tab twice daily for 4 days, then take one 1 mg tab twice daily. 03/26/18 04/26/18 Yes Galen Manila, NP      VITAL SIGNS:  Blood pressure (!) 128/94, pulse 71, temperature 98.2 F (36.8 C), resp. rate 10, height 6' (1.829 m), weight 79.8 kg, SpO2 97 %.  PHYSICAL EXAMINATION:  Physical Exam Constitutional:      General: He is awake. He is not in acute distress.    Appearance: Normal appearance. He is well-developed and normal weight. He is not ill-appearing, toxic-appearing or diaphoretic.     Interventions: He is not intubated. HENT:     Head: Atraumatic.     Mouth/Throat:     Mouth: Mucous membranes are moist.     Dentition: Normal dentition. No dental caries or gum lesions.     Pharynx: Oropharynx is clear. No oropharyngeal exudate or posterior oropharyngeal erythema.  Eyes:     General: Lids are normal.     Extraocular Movements: Extraocular movements intact.  Neck:     Musculoskeletal: Neck supple.    Cardiovascular:     Rate and Rhythm: Normal rate and regular rhythm.  No extrasystoles are present.    Heart sounds: S1 normal and S2 normal. Heart sounds not distant. Murmur present. Systolic murmur present with a grade of 2/6. No diastolic murmur. No friction rub. No gallop. No S3 or S4 sounds.   Pulmonary:     Effort: Pulmonary effort is normal. No tachypnea, bradypnea, accessory muscle usage, prolonged expiration, respiratory distress or retractions. He is not intubated.     Breath sounds: Normal breath sounds and air entry. No stridor, decreased air movement or transmitted upper airway sounds. No decreased breath sounds, wheezing, rhonchi or rales.  Abdominal:     General: Bowel sounds are decreased. There is no distension.     Palpations: Abdomen is soft.     Tenderness: There is no abdominal tenderness. There is no guarding or rebound.  Musculoskeletal: Normal  range of motion.        General: No swelling or tenderness.     Right lower leg: No edema.     Left lower leg: No edema.  Lymphadenopathy:     Cervical: No cervical adenopathy.  Skin:    General: Skin is warm and dry.     Findings: No erythema or rash.  Neurological:     General: No focal deficit present.     Mental Status: He is alert and oriented to person, place, and time. Mental status is at baseline.  Psychiatric:        Attention and Perception: Attention and perception normal.        Mood and Affect: Mood and affect normal.        Speech: Speech normal.        Behavior: Behavior normal. Behavior is cooperative.        Thought Content: Thought content normal.        Cognition and Memory: Cognition and memory normal.        Judgment: Judgment normal.    LABORATORY PANEL:   CBC Recent Labs  Lab 03/28/18 2316  WBC 11.7*  HGB 13.4  HCT 40.9  PLT 346   ------------------------------------------------------------------------------------------------------------------  Chemistries  Recent Labs  Lab  03/28/18 2316  NA 135  K 3.5  CL 102  CO2 27  GLUCOSE 85  BUN 11  CREATININE 0.92  CALCIUM 8.3*  AST 29  ALT 16  ALKPHOS 213*  BILITOT 0.8   ------------------------------------------------------------------------------------------------------------------  Cardiac Enzymes Recent Labs  Lab 03/28/18 2316  TROPONINI <0.03   ------------------------------------------------------------------------------------------------------------------  RADIOLOGY:  Dg Chest 2 View  Result Date: 03/28/2018 CLINICAL DATA:  Sharp pain in the left chest with movement and breathing. EXAM: CHEST - 2 VIEW COMPARISON:  11/24/2017 FINDINGS: Borderline cardiomegaly. Nonaneurysmal thoracic aorta. Faint subsegmental pulmonary opacities project over the lingula and both lower lobes suspicious for atelectasis and/or pneumonia. Minimal blunting the lateral costophrenic angles may represent trace pleural effusions or pleural thickening. No overt pulmonary edema. Degenerative change is noted along dorsal spine. IMPRESSION: Faint airspace opacities in the lingula and both lower lobes, suspicious for atelectasis and/or pneumonia. Electronically Signed   By: Tollie Ethavid  Kwon M.D.   On: 03/28/2018 23:38   Ct Angio Chest Pe W/cm &/or Wo Cm  Result Date: 03/29/2018 CLINICAL DATA:  Sharp pain in the left with movement and deep breaths. The suspected. EXAM: CT ANGIOGRAPHY CHEST WITH CONTRAST TECHNIQUE: Multidetector CT imaging of the chest was performed using the standard protocol during bolus administration of intravenous contrast. Multiplanar CT image reconstructions and MIPs were obtained to evaluate the vascular anatomy. CONTRAST:  100mL OMNIPAQUE IOHEXOL 350 MG/ML SOLN COMPARISON:  Same day radiographs FINDINGS: Cardiovascular: Satisfactory opacification of the pulmonary arteries to the segmental level. No evidence of pulmonary embolism. Normal heart size. No pericardial effusion. Uncoiled appearance of the thoracic aorta without  aneurysm or dissection. Mild ectasia of the ascending thoracic aorta to 3.7 cm. Mediastinum/Nodes: No enlarged mediastinal, hilar, or axillary lymph nodes. Thyroid gland, trachea, and esophagus demonstrate no significant findings. Lungs/Pleura: Bilateral multilobar pulmonary opacities, one with possible faint cavitation in the lingula, series 6/55 raise concern for septic emboli. Tiny subpleural nodules in the lingula are also noted measuring between 1 and 4.8 mm. No effusion or pneumothorax. Upper Abdomen: No acute abnormality. Musculoskeletal: No chest wall abnormality. No acute or significant osseous findings. Review of the MIP images confirms the above findings. IMPRESSION: 1. No acute pulmonary embolus. 2.  Bilateral multilobar pulmonary opacities, one with possible faint cavitation in the lingula raise concern for possible septic emboli. Neoplastic etiologies be less likely. 3. Tiny subpleural nodules in the lingula measuring between 1 and 4.8 mm are identified. No follow-up needed if patient is low-risk (and has no known or suspected primary neoplasm). Non-contrast chest CT can be considered in 12 months if patient is high-risk. This recommendation follows the consensus statement: Guidelines for Management of Incidental Pulmonary Nodules Detected on CT Images: From the Fleischner Society 2017; Radiology 2017; 284:228-243. Electronically Signed   By: Tollie Ethavid  Kwon M.D.   On: 03/29/2018 03:13   IMPRESSION AND PLAN:   A/P: 63M w/ PMHx hidradenitis suppurativa (Remicade) p/w CP. CTA chest (+) "Bilateral multilobar pulmonary opacities, one with possible faint cavitation in the lingula raise concern for possible septic emboli." Presentation concerning for bacterial endocarditis. EtOH use/dependence. Hypocalcemia, hypoalbuminemia, leukocytosis. -CP, multilobar pulmonary opacities, possible septic emboli: Pt p/w progressively-worsening CP x4-5d (as per HPI). CTA chest (+) bilateral multilobar pulmonary opacities.  Presentation concerning for septic emboli, bacterial endocarditis. Denies IVDA. Good dentition. Has not had colonoscopy yet. BCx pending. WBC 11.7, afebrile, SIRS (-). PCT 0.59. Lactate 0.7. Vancomycin. Echo. If (-), may warrant Cardiology consult for TEE, ID consult. IVF, symptomatic mgmt, pain ctrl. -EtOH use/dependence: Consumes a significant quantity of alcohol. CIWA protocol, Ativan PRN, thiamine/folate/MVI. -Hypocalcemia: Ionized calcium. -Hypoalbuminemia: Prealbumin. -Hidradenitis suppurativa: Wound nurse. Foam wedge. -FEN/GI: Regular diet. -DVT PPx: Lovenox. -Code status: Full code. -Disposition: Admission, > 2 midnights.   All the records are reviewed and case discussed with ED provider. Management plans discussed with the patient, family and they are in agreement.  CODE STATUS: Full code.  TOTAL TIME TAKING CARE OF THIS PATIENT: 75 minutes.    Barbaraann RondoPrasanna Sylvanna Burggraf M.D on 03/29/2018 at 5:06 AM  Between 7am to 6pm - Pager - [phone]  After 6pm go to [email or web address] - password [email or web address] [facility]  [facility] [place] [facility]  Office  [phone]  CC: Primary care physician; Galen ManilaKennedy, Lauren Renee, NP   Note: This dictation was prepared with Dragon dictation along with smaller phrase technology. Any transcriptional errors that result from this process are unintentional.

## 2018-03-29 NOTE — Progress Notes (Signed)
Pharmacy Antibiotic Note  Benjamin Owens is a 47 y.o. male admitted on 03/29/2018 with pneumonia.  Pharmacy has been consulted for vancomycin dosing. Patient w/ h/o IV drug abuse arrives for CP and abdominal pain. Patient also appears to have some splintering on finger nails and possible janeway lesions concerning for bacteremia/endocarditis. Patient received vanc 1.5g IV x 1 in ED.  Plan: Will continue vanc 1g IV q8h w/ 6 hour stack  Will draw trough 01/07 @ 0400 prior to 4th dose.  Ke 0.0938 T1/2 8 hrs  Goal trough 15 - 20 mcg/mL  03/29/18-  Patient with itching/redness and Vancomycin dose in am was stopped. Per discussion with MD will add Benadryl prior to Vancomycin doses for now and continue Vancomycin per MD. F/u Blood cx     Height: 6' (182.9 cm) Weight: 176 lb (79.8 kg) IBW/kg (Calculated) : 77.6  Temp (24hrs), Avg:98.1 F (36.7 C), Min:97.9 F (36.6 C), Max:98.2 F (36.8 C)  Recent Labs  Lab 03/28/18 2316 03/29/18 0342  WBC 11.7*  --   CREATININE 0.92  --   LATICACIDVEN  --  0.7    Estimated Creatinine Clearance: 107.8 mL/min (by C-G formula based on SCr of 0.92 mg/dL).    Allergies  Allergen Reactions  . Vancomycin Itching    Itching at IV site and redness  . Azithromycin Itching    Pt arm itching after receiving IV Zithromax   Thank you for allowing pharmacy to be a part of this patient's care.  Bari Mantis PharmD Clinical Pharmacist 03/29/2018

## 2018-03-29 NOTE — Consult Note (Signed)
WOC nurse consulted for hydradenitis suppurativa Conservative topical treatment ordered with dry absorbant dressings or peripads which work well for this area.   Provided local sites that provide treatment and management of this dermatologic issues.   [facility]  Dr. Tammi [facility] Information/Appointments [id number] [place], [place] [address] Telephone: [phone] Fax: [phone]    Department of Dermatology Dr. Brayton Caves  [facility], [facility] [address] [place], [place], [place] [address]  [facility] [address] [place], [place] [address] To make, change or cancel an appointment please contact the dermatology appointment center at [phone].   [facility] [phone] - Marcy Panning [phone] - [address] [id number] [place] [address] Phone number:  [phone] - Fax Number   Re consult if needed, will not follow at this time. Thanks  Tonga Prout [employer], RN,CWOCN, CNS, CWON-AP [phone])

## 2018-03-30 ENCOUNTER — Inpatient Hospital Stay (HOSPITAL_COMMUNITY)
Admit: 2018-03-30 | Discharge: 2018-03-30 | Disposition: A | Discharge status: Home / Self Care | Payer: BLUE CROSS/BLUE SHIELD | Attending: Internal Medicine | Admitting: Internal Medicine

## 2018-03-30 DIAGNOSIS — I361 Nonrheumatic tricuspid (valve) insufficiency: Secondary | ICD-10-CM

## 2018-03-30 LAB — ECHOCARDIOGRAM COMPLETE

## 2018-03-30 LAB — CBC
HCT: 37.4 % — ABNORMAL LOW (ref 39.0–52.0)
Hemoglobin: 12.5 g/dL — ABNORMAL LOW (ref 13.0–17.0)
MCH: 27.3 pg (ref 26.0–34.0)
MCHC: 33.4 g/dL (ref 30.0–36.0)
MCV: 81.7 fL (ref 80.0–100.0)
Platelets: 282 10*3/uL (ref 150–400)
RBC: 4.58 MIL/uL (ref 4.22–5.81)
RDW: 15.7 % — ABNORMAL HIGH (ref 11.5–15.5)
WBC: 21 10*3/uL — ABNORMAL HIGH (ref 4.0–10.5)
nRBC: 0 % (ref 0.0–0.2)

## 2018-03-30 LAB — BASIC METABOLIC PANEL
Anion gap: 8 (ref 5–15)
BUN: 9 mg/dL (ref 6–20)
CO2: 22 mmol/L (ref 22–32)
Calcium: 7.9 mg/dL — ABNORMAL LOW (ref 8.9–10.3)
Chloride: 103 mmol/L (ref 98–111)
Creatinine, Ser: 0.94 mg/dL (ref 0.61–1.24)
GFR calc Af Amer: >60 mL/min (ref >60)
GFR calc non Af Amer: >60 mL/min (ref >60)
Glucose, Bld: 133 mg/dL — ABNORMAL HIGH (ref 70–99)
Potassium: 2.8 mmol/L — ABNORMAL LOW (ref 3.5–5.1)
Sodium: 133 mmol/L — ABNORMAL LOW (ref 135–145)

## 2018-03-30 LAB — VANCOMYCIN, TROUGH: Vancomycin Tr: 16 ug/mL (ref 15–20)

## 2018-03-30 LAB — CALCIUM, IONIZED: Calcium, Ionized, Serum: 4.8 mg/dL (ref 4.5–5.6)

## 2018-03-30 LAB — MAGNESIUM: Magnesium: 1.4 mg/dL — ABNORMAL LOW (ref 1.7–2.4)

## 2018-03-30 MED ORDER — ACETAMINOPHEN 325 MG PO TABS
650.0000 mg | ORAL_TABLET | Freq: Four times a day (QID) | ORAL | Status: DC | PRN
  Administered 2018-03-30 (×2): 650 mg via ORAL
  Filled 2018-03-30 (×2): qty 2

## 2018-03-30 MED ORDER — MAGNESIUM SULFATE 4 GM/100ML IV SOLN
4.0000 g | Freq: Once | INTRAVENOUS | Status: AC
  Administered 2018-03-30: 4 g via INTRAVENOUS
  Filled 2018-03-30: qty 100

## 2018-03-30 MED ORDER — PIPERACILLIN-TAZOBACTAM 3.375 G IVPB: 3.3750 g | Freq: Three times a day (TID) | INTRAVENOUS | Status: DC

## 2018-03-30 MED ORDER — POTASSIUM CHLORIDE CRYS ER 20 MEQ PO TBCR
40.0000 meq | EXTENDED_RELEASE_TABLET | Freq: Three times a day (TID) | ORAL | Status: DC
  Administered 2018-03-30 (×3): 40 meq via ORAL
  Filled 2018-03-30 (×3): qty 2

## 2018-03-30 MED ORDER — SODIUM CHLORIDE 0.9 % IV SOLN
INTRAVENOUS | Status: AC
  Administered 2018-03-30 (×3): via INTRAVENOUS

## 2018-03-30 MED ORDER — PIPERACILLIN-TAZOBACTAM 3.375 G IVPB
3.3750 g | Freq: Three times a day (TID) | INTRAVENOUS | Status: DC
  Administered 2018-03-30 – 2018-03-31 (×3): 3.375 g via INTRAVENOUS
  Filled 2018-03-30 (×3): qty 50

## 2018-03-30 MED ORDER — SODIUM CHLORIDE 0.9 % IV SOLN
INTRAVENOUS | Status: DC | PRN
  Administered 2018-03-31: 1000 mL via INTRAVENOUS

## 2018-03-30 NOTE — Progress Notes (Addendum)
Pharmacy Antibiotic Note  Benjamin Owens is a 48 y.o. male admitted on 03/29/2018 with pneumonia.  Pharmacy has been consulted for vancomycin/zosyn dosing. Patient w/ h/o IV drug abuse arrives for CP and abdominal pain. Patient also appears to have some splintering on finger nails and possible janeway lesions concerning for bacteremia/endocarditis. Patient received vanc 1.5g IV x 1 in ED.  Plan:  Zosyn: Start Zosyn 3.375g q8h (extended infusion) - Consult for sepsis   Vancomycin: Will continue vanc 1g IV q8h w/ 6 hour stack  Trough drawn 01/07 @ 1330 prior to 4th dose - 16 - at goal  Ke 0.0938 T1/2 8 hrs  Goal trough 15 - 20 mcg/mL  03/29/18-  Patient with itching/redness and Vancomycin dose in am was stopped. Per discussion with MD will add Benadryl prior to Vancomycin doses for now and continue Vancomycin per MD. F/u Blood cx     Height: 6' (182.9 cm) Weight: 176 lb (79.8 kg) IBW/kg (Calculated) : 77.6  Temp (24hrs), Avg:98.7 F (37.1 C), Min:97.9 F (36.6 C), Max:100.4 F (38 C)  Recent Labs  Lab 03/28/18 2316 03/29/18 0342 03/30/18 0636  WBC 11.7*  --  21.0*  CREATININE 0.92  --  0.94  LATICACIDVEN  --  0.7  --     Estimated Creatinine Clearance: 105.5 mL/min (by C-G formula based on SCr of 0.94 mg/dL).    Allergies  Allergen Reactions  . Vancomycin Itching    Itching at IV site and redness  . Azithromycin Itching    Pt arm itching after receiving IV Zithromax   Thank you for allowing pharmacy to be a part of this patient's care.  Albina Billet, PharmD, BCPS Clinical Pharmacist 03/30/2018 1:03 PM

## 2018-03-30 NOTE — Progress Notes (Signed)
*  PRELIMINARY RESULTS* Echocardiogram 2D Echocardiogram has been performed.  Cristela Blue 03/30/2018, 9:32 AM

## 2018-03-30 NOTE — Consult Note (Signed)
PHARMACY CONSULT NOTE - FOLLOW UP  Pharmacy Consult for Electrolyte Monitoring and Replacement   Recent Labs: Potassium (mmol/L)  Date Value  03/30/2018 2.8 (L)  10/04/2012 4.3   Magnesium (mg/dL)  Date Value  38/75/6433 1.4 (L)   Calcium (mg/dL)  Date Value  29/51/8841 7.9 (L)   Calcium, Total (mg/dL)  Date Value  66/08/3014 9.2   Albumin (g/dL)  Date Value  04/01/3233 3.1 (L)  12/28/2017 3.1 (L)   Phosphorus (mg/dL)  Date Value  57/32/2025 2.5   Sodium (mmol/L)  Date Value  03/30/2018 133 (L)  12/28/2017 137  10/04/2012 139     Assessment: Pharmacy has been consulted to replenish and monitor electrolytes  Goal of Therapy:  Electrolytes wnl's  Plan:  Kcl tid and Mg IV 4g x 1  Recheck electrolytes with am labs  Albina Billet, PharmD, BCPS Clinical Pharmacist 03/30/2018 3:38 PM

## 2018-03-30 NOTE — Progress Notes (Signed)
[employer] - [place] at [facility]   PATIENT NAME: Benjamin Owens    MR#:  [id number]  DATE OF BIRTH:  [birth year]  SUBJECTIVE:   Patient states he had a rough night last night.  He had fevers and chills.  Temperature 100.4 F.  Also noted to be tachycardic.  States he is feeling little bit better this morning.  Endorses some occasional shortness of breath.  REVIEW OF SYSTEMS:  Review of Systems  Constitutional: Positive for chills and fever.  HENT: Negative for congestion and sinus pain.   Eyes: Negative for blurred vision and double vision.  Respiratory: Positive for cough and shortness of breath.   Cardiovascular: Negative for chest pain and palpitations.  Gastrointestinal: Negative for nausea.  Genitourinary: Negative for dysuria and urgency.  Musculoskeletal: Negative for back pain and neck pain.  Neurological: Negative for dizziness and headaches.  Psychiatric/Behavioral: Negative for depression. The patient is not nervous/anxious.     DRUG ALLERGIES:   Allergies  Allergen Reactions  . Vancomycin Itching    Itching at IV site and redness  . Azithromycin Itching    Pt arm itching after receiving IV Zithromax   VITALS:  Blood pressure 102/72, pulse 87, temperature 99.3 F (37.4 C), temperature source Oral, resp. rate 19, height 6' (1.829 m), weight 79.8 kg, SpO2 97 %. PHYSICAL EXAMINATION:  Physical Exam  Constitutional:  Sleepy but easily arousable.  Lying in bed in no acute distress. HEENT: Normocephalic, atraumatic, EOMI, moist mucous membranes Neck: Supple, normal range of motion Cardiovascular: Tachycardic, regular rhythm.  No murmurs, rubs, gallops Respiratory: Normal respiratory effort.  No retractions. Lungs CTAB and moving good air.  Speaking in full sentences. Gastrointestinal: Soft, nontender, nondistended Musculoskeletal: No lower extremity edema   Neurologic:   CN II through XII grossly intact, normal muscle strength throughout,  sensation intact. Skin:  No rashes or lesions appreciated Psychiatric: Mood and affect are normal. Speech and behavior are normal. LABORATORY PANEL:  Male CBC Recent Labs  Lab 03/30/18 0636  WBC 21.0*  HGB 12.5*  HCT 37.4*  PLT 282   ------------------------------------------------------------------------------------------------------------------ Chemistries  Recent Labs  Lab 03/28/18 2316  03/30/18 0636  NA 135  --  133*  K 3.5  --  2.8*  CL 102  --  103  CO2 27  --  22  GLUCOSE 85  --  133*  BUN 11  --  9  CREATININE 0.92  --  0.94  CALCIUM 8.3*  --  7.9*  MG  --    < > 1.4*  AST 29  --   --   ALT 16  --   --   ALKPHOS 213*  --   --   BILITOT 0.8  --   --    < > = values in this interval not displayed.   RADIOLOGY:  No results found. ASSESSMENT AND PLAN:   Bilateral multilobar pulmonary lesions- ?septic emboli versus CAP.  No history of bacterial endocarditis or IV drug use.  Meeting sepsis criteria early this morning with temp 100.46F, HR 100s, WBC 21. -Continue vancomycin -Add Zosyn for broad-spectrum coverage -Echo pending to assess valves -Patient may ultimately need TEE -Follow-up blood cultures -ID consulted for further recommendations -Start IV fluids  Severe hypokalemia/hypomagnesemia- K 2.8 this morning -Replete and recheck  Alcohol use -Continue CIWA  History of hidradenitis suppurativa -Seen by wound care nurse  All the records are reviewed and case discussed with Care Management/Social Worker. Management plans discussed with  the patient, family and they are in agreement.  CODE STATUS: Full Code  TOTAL TIME TAKING CARE OF THIS PATIENT: 45 minutes.   More than 50% of the time was spent in counseling/coordination of care: YES  POSSIBLE D/C IN 2-3 DAYS, DEPENDING ON CLINICAL CONDITION.   Jinny Blossom  M.D on 03/30/2018 at 6:14 PM  Between 7am to 6pm - Pager [phone]  After 6pm go to [email or web address] - password [email or web address] [facility]  [facility] [place] [facility]  Office  [phone]  CC: Primary care physician; Galen Manila, NP  Note: This dictation was prepared with Dragon dictation along with smaller phrase technology. Any transcriptional errors that result from this process are unintentional.

## 2018-03-30 NOTE — Progress Notes (Signed)
Patient having withdrawal symptoms this morning ( N/V, Tachycardia, increased BP anxiety ect). Received Ativan 1 mg IV as needed . Will continue to monitor.

## 2018-03-30 NOTE — Plan of Care (Signed)
  Problem: Clinical Measurements: Goal: Ability to maintain clinical measurements within normal limits will improve Outcome: Progressing   Problem: Pain Managment: Goal: General experience of comfort will improve Outcome: Progressing   

## 2018-03-31 DIAGNOSIS — Z881 Allergy status to other antibiotic agents status: Secondary | ICD-10-CM

## 2018-03-31 DIAGNOSIS — F17210 Nicotine dependence, cigarettes, uncomplicated: Secondary | ICD-10-CM

## 2018-03-31 DIAGNOSIS — R7989 Other specified abnormal findings of blood chemistry: Secondary | ICD-10-CM

## 2018-03-31 DIAGNOSIS — L732 Hidradenitis suppurativa: Secondary | ICD-10-CM

## 2018-03-31 DIAGNOSIS — R0789 Other chest pain: Secondary | ICD-10-CM

## 2018-03-31 DIAGNOSIS — R918 Other nonspecific abnormal finding of lung field: Secondary | ICD-10-CM

## 2018-03-31 DIAGNOSIS — Z79899 Other long term (current) drug therapy: Secondary | ICD-10-CM

## 2018-03-31 DIAGNOSIS — D72829 Elevated white blood cell count, unspecified: Secondary | ICD-10-CM

## 2018-03-31 DIAGNOSIS — Z792 Long term (current) use of antibiotics: Secondary | ICD-10-CM

## 2018-03-31 LAB — BASIC METABOLIC PANEL
Anion gap: 7 (ref 5–15)
BUN: 12 mg/dL (ref 6–20)
CO2: 21 mmol/L — ABNORMAL LOW (ref 22–32)
CREATININE: 1.12 mg/dL (ref 0.61–1.24)
Calcium: 7.9 mg/dL — ABNORMAL LOW (ref 8.9–10.3)
Chloride: 104 mmol/L (ref 98–111)
GFR calc Af Amer: >60 mL/min (ref >60)
GFR calc non Af Amer: >60 mL/min (ref >60)
Glucose, Bld: 99 mg/dL (ref 70–99)
Potassium: 3.9 mmol/L (ref 3.5–5.1)
Sodium: 132 mmol/L — ABNORMAL LOW (ref 135–145)

## 2018-03-31 LAB — MAGNESIUM: Magnesium: 2 mg/dL (ref 1.7–2.4)

## 2018-03-31 LAB — CBC
HCT: 35.8 % — ABNORMAL LOW (ref 39.0–52.0)
Hemoglobin: 11.8 g/dL — ABNORMAL LOW (ref 13.0–17.0)
MCH: 26.9 pg (ref 26.0–34.0)
MCHC: 33 g/dL (ref 30.0–36.0)
MCV: 81.7 fL (ref 80.0–100.0)
Platelets: 263 10*3/uL (ref 150–400)
RBC: 4.38 MIL/uL (ref 4.22–5.81)
RDW: 15.8 % — ABNORMAL HIGH (ref 11.5–15.5)
WBC: 25.3 10*3/uL — ABNORMAL HIGH (ref 4.0–10.5)
nRBC: 0 % (ref 0.0–0.2)

## 2018-03-31 LAB — MRSA PCR SCREENING: MRSA by PCR: NEGATIVE

## 2018-03-31 LAB — INFLUENZA PANEL BY PCR (TYPE A & B)
INFLBPCR: NEGATIVE
Influenza A By PCR: NEGATIVE

## 2018-03-31 LAB — VANCOMYCIN, TROUGH: Vancomycin Tr: 15 ug/mL (ref 15–20)

## 2018-03-31 MED ORDER — SODIUM CHLORIDE 0.9 % IV SOLN
INTRAVENOUS | Status: AC
  Administered 2018-03-31: 16:00:00 via INTRAVENOUS

## 2018-03-31 MED ORDER — LEVOFLOXACIN IN D5W 750 MG/150ML IV SOLN
750.0000 mg | INTRAVENOUS | Status: DC
  Administered 2018-03-31 – 2018-04-01 (×2): 750 mg via INTRAVENOUS
  Filled 2018-03-31 (×3): qty 150

## 2018-03-31 MED ORDER — OXYCODONE-ACETAMINOPHEN 5-325 MG PO TABS
1.0000 | ORAL_TABLET | ORAL | Status: DC | PRN
  Administered 2018-03-31 – 2018-04-01 (×7): 1 via ORAL
  Filled 2018-03-31 (×7): qty 1

## 2018-03-31 NOTE — Plan of Care (Signed)
  Problem: Clinical Measurements: Goal: Diagnostic test results will improve Outcome: Progressing   Problem: Activity: Goal: Risk for activity intolerance will decrease Outcome: Progressing   Problem: Pain Managment: Goal: General experience of comfort will improve Outcome: Progressing   

## 2018-03-31 NOTE — Progress Notes (Signed)
[facility] - [place] at [facility]   PATIENT NAME: Benjamin Owens    MR#:  [id number]  DATE OF BIRTH:  May 05, 1970  SUBJECTIVE: Admitted yesterday for chest pain, shortness of breath, cough.  Patient now complains of pleuritic chest pain.  White count is still high but no fever, no hypoxia.  CHIEF COMPLAINT:   Chief Complaint  Patient presents with  . Chest Pain  . Abdominal Pain    REVIEW OF SYSTEMS:   ROS CONSTITUTIONAL: No fever, fatigue or weakness.  EYES: No blurred or double vision.  EARS, NOSE, AND THROAT: No tinnitus or ear pain.  RESPIRATORY: Dry cough, pleuritic chest pain. CARDIOVASCULAR: No chest pain, orthopnea, edema.  GASTROINTESTINAL: No nausea, vomiting, diarrhea or abdominal pain.  GENITOURINARY: No dysuria, hematuria.  ENDOCRINE: No polyuria, nocturia,  HEMATOLOGY: No anemia, easy bruising or bleeding SKIN: No rash or lesion. MUSCULOSKELETAL: No joint pain or arthritis.   NEUROLOGIC: No tingling, numbness, weakness.  PSYCHIATRY: No anxiety or depression.   DRUG ALLERGIES:   Allergies  Allergen Reactions  . Vancomycin Itching    Itching at IV site and redness  . Azithromycin Itching    Pt arm itching after receiving IV Zithromax    VITALS:  Blood pressure (!) 105/58, pulse 81, temperature 98.3 F (36.8 C), resp. rate 20, height 6' (1.829 m), weight 78 kg, SpO2 98 %.  PHYSICAL EXAMINATION:  GENERAL:  48 y.o.-year-old patient lying in the bed with no acute distress.  EYES: Pupils equal, round, reactive to light and accommodation. No scleral icterus. Extraocular muscles intact.  HEENT: Head atraumatic, normocephalic. Oropharynx and nasopharynx clear.  NECK:  Supple, no jugular venous distention. No thyroid enlargement, no tenderness.  LUNGS: Normal breath sounds bilaterally, no wheezing, rales,rhonchi or crepitation. No use of accessory muscles of respiration.  CARDIOVASCULAR: S1, S2 normal. No murmurs, rubs, or gallops.   ABDOMEN: Soft, nontender, nondistended. Bowel sounds present. No organomegaly or mass.  EXTREMITIES: No pedal edema, cyanosis, or clubbing.  NEUROLOGIC: Cranial nerves II through XII are intact. Muscle strength 5/5 in all extremities. Sensation intact. Gait not checked.  PSYCHIATRIC: The patient is alert and oriented x 3.  SKIN: No obvious rash, lesion, or ulcer.    LABORATORY PANEL:   CBC Recent Labs  Lab 03/31/18 0428  WBC 25.3*  HGB 11.8*  HCT 35.8*  PLT 263   ------------------------------------------------------------------------------------------------------------------  Chemistries  Recent Labs  Lab 03/28/18 2316  03/31/18 0428  NA 135   < > 132*  K 3.5   < > 3.9  CL 102   < > 104  CO2 27   < > 21*  GLUCOSE 85   < > 99  BUN 11   < > 12  CREATININE 0.92   < > 1.12  CALCIUM 8.3*   < > 7.9*  MG  --    < > 2.0  AST 29  --   --   ALT 16  --   --   ALKPHOS 213*  --   --   BILITOT 0.8  --   --    < > = values in this interval not displayed.   ------------------------------------------------------------------------------------------------------------------  Cardiac Enzymes Recent Labs  Lab 03/28/18 2316  TROPONINI <0.03   ------------------------------------------------------------------------------------------------------------------  RADIOLOGY:  No results found.  EKG:   Orders placed or performed during the hospital encounter of 03/29/18  . EKG 12-Lead  . EKG 12-Lead  . ED EKG within 10 minutes  . ED EKG within  10 minutes    ASSESSMENT AND PLAN:   Multilobar pneumonia/septic emboli: Patient is on IV antibiotics, white count is still high, appreciate ID consult, echocardiogram did not show any vegetations, patient may need TEE,  #2 acute kidney injury likely due to contrast related, start gentle hydration, renally dose vancomycin 3.  Alcohol abuse, last drink was Sunday, f on January 5, continue CIWA protocol. 4.  Pleuritic chest pain, use  Percocet instead of morphine for pain control as needed.  All the records are reviewed and case discussed with Care Management/Social Workerr. Management plans discussed with the patient, family and they are in agreement.  CODE STATUS: Full. TOTAL TIME TAKING CARE OF THIS PATIENT35: minutes.   POSSIBLE D/C IN  1-2 DAYS, DEPENDING ON CLINICAL CONDITION.  More than50% time spent in counseling, coordination of care. Katha Hamming M.D on 03/31/2018 at 1:56 PM  Between 7am to 6pm - Pager - [phone]  After 6pm go to [email or web address] - password [email or web address] [facility]  [place] [facility]  Office  [phone]  CC: Primary care physician; Galen Manila, NP   Note: This dictation was prepared with Dragon dictation along with smaller phrase technology. Any transcriptional errors that result from this process are unintentional.

## 2018-03-31 NOTE — Plan of Care (Signed)
  Problem: Clinical Measurements: Goal: Diagnostic test results will improve Outcome: Progressing   Problem: Clinical Measurements: Goal: Cardiovascular complication will be avoided Outcome: Progressing   Problem: Health Behavior/Discharge Planning: Goal: Ability to manage health-related needs will improve Outcome: Progressing

## 2018-03-31 NOTE — Consult Note (Addendum)
NAME: Benjamin Owens  DOB: 02/12/1971  MRN: [id number]  Date/Time: 03/31/2018 3:00 PM Subjective:  REASON FOR CONSULT: Bilateral pulmonary opacities rule out septic emboli ? Benjamin Owens is a 48 y.o. male with a history of hidradenitis suppurativa on Remicade presents to the hospital with chest pain.  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  On Friday he had gone to get his physical with his primary care doctor and had told her that he had felt some chest pain on the left side.  She examined him and ruled out any cardiovascular event.  Over the weekend the pain got worse and it was on present on the right side as well and whenever he took a deep breath it was hurting him.  So he came to the hospital.  He was also having subjective fever.  He has a baseline cough because of smoking but that did not increase he says.  He did not have any hemoptysis. He does not have a productive cough.  He [facility] for his hidradenitis his last infusion was 03/11/2018.  He also takes oral clindamycin for hidradenitis. He denies any sort of IV drug use. He denies any recent sore throat or painful throat. He smokes marijuana He vaped a month ago. No travel history No recent use of steroids. No water activities No yardwork No pets at home No contact with any animals. Not sexually active now.  Has had sex with women . Last year he had seen the gastroenterologist for abnormal LFTs which was thought to be due to rifampin along with alcohol.  He drinks 316 ounce beer every day and during the weekend he could drink up to 13/day.He denies passing out on alcohol .  When patient came to the ED on 1/ 5.  Temperature was 98.2, pulse was in 71 blood pressure was 131/93 and respiratory rate was 17. PE was questioned and he had a CTA which showed bilateral multilobar pulmonary opacities with a question of one opacity with possible faint cavitation in the lingula raising concern for  septic emboli. Blood cultures were sent.  He was initially given IV ceftriaxone and azithromycin and had a reaction locally at the site of the needle. Asked to see the patient as his white count is increasing, and for the question of septic emboli. Blood cultures are negative.  2D echo was normal Past Medical History:  Diagnosis Date  . Hay fever   . Hidradenitis suppurativa     Past Surgical History:  Procedure Laterality Date  . NO PAST SURGERIES      Social History   Socioeconomic History  . Marital status: Single    Spouse name: Not on file  . Number of children: Not on file  . Years of education: Not on file  . Highest education level: Not on file  Occupational History  . Not on file  Social Needs  . Financial resource strain: Not on file  . Food insecurity:    Worry: Not on file    Inability: Not on file  . Transportation needs:    Medical: Not on file    Non-medical: Not on file  Tobacco Use  . Smoking status: Current Every Day Smoker    Packs/day: 0.50  . Smokeless tobacco: Never Used  Substance and Sexual Activity  . Alcohol use: Yes    Alcohol/week: 3.0 standard drinks    Types: 3 Cans of beer per week  Comment: weekdays 3 x 16 oz beers; Weekend approx 2 cases over Sat/Sun.  . Drug use: Never  . Sexual activity: Not on file  Lifestyle  . Physical activity:    Days per week: Not on file    Minutes per session: Not on file  . Stress: Not on file  Relationships  . Social connections:    Talks on phone: Not on file    Gets together: Not on file    Attends religious service: Not on file    Active member of club or organization: Not on file    Attends meetings of clubs or organizations: Not on file    Relationship status: Not on file  . Intimate partner violence:    Fear of current or ex partner: Not on file    Emotionally abused: Not on file    Physically abused: Not on file    Forced sexual activity: Not on file  Other Topics Concern  . 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     Family History  Problem Relation Age of Onset  . Stroke Mother   . Heart disease Mother        Degenerative heart disease  . Diabetes Mother   . Throat cancer Father   . Lung cancer Maternal Aunt   . Breast cancer Paternal Aunt   . Lung cancer Paternal Uncle        smoker  . Healthy Maternal Grandmother   . Heart attack Maternal Grandfather   . Breast cancer Paternal Grandmother   . Heart attack Paternal Grandfather   . Healthy Cousin   . Prostate cancer Paternal Uncle   . Breast cancer Maternal Aunt    Allergies  Allergen Reactions  . Vancomycin Itching    Itching at IV site and redness  . Azithromycin Itching    Pt arm itching after receiving IV Zithromax    ? Current Facility-Administered Medications  Medication Dose Route Frequency Provider Last Rate Last Dose  . 0.9 %  sodium chloride infusion   Intravenous PRN Katha Hamming, MD 10 mL/hr at 03/31/18 0620 1,000 mL at 03/31/18 0620  . 0.9 %  sodium chloride infusion   Intravenous Continuous Katha Hamming, MD      . acetaminophen (TYLENOL) tablet 650 mg  650 mg Oral Q6H PRN Campbell Stall, MD   650 mg at 03/30/18 2027  . bisacodyl (DULCOLAX) EC tablet 5 mg  5 mg Oral Daily PRN Barbaraann Rondo, MD      . enoxaparin (LOVENOX) injection 40 mg  40 mg Subcutaneous Q24H Barbaraann Rondo, MD   40 mg at 03/31/18 0536  . folic acid (FOLVITE) tablet 1 mg  1 mg Oral Daily Barbaraann Rondo, MD   1 mg at 03/31/18 1008  . LORazepam (ATIVAN) injection 0-4 mg  0-4 mg Intravenous Q12H Barbaraann Rondo, MD      . LORazepam (ATIVAN) tablet 1 mg  1 mg Oral Q6H PRN Barbaraann Rondo, MD   1 mg at 03/30/18 1029   Or  . LORazepam (ATIVAN) injection 1 mg  1 mg Intravenous Q6H PRN Barbaraann Rondo, MD      . multivitamin with minerals tablet 1 tablet  1 tablet Oral Daily Barbaraann Rondo, MD   1 tablet at 03/31/18 1008  . ondansetron  (ZOFRAN) tablet 4 mg  4 mg Oral Q6H PRN Barbaraann Rondo, MD       Or  . ondansetron (  ZOFRAN) injection 4 mg  4 mg Intravenous Q6H PRN Barbaraann RondoSridharan, Prasanna, MD   4 mg at 03/30/18 0401  . oxyCODONE-acetaminophen (PERCOCET/ROXICET) 5-325 MG per tablet 1 tablet  1 tablet Oral Q4H PRN Katha HammingKonidena, Snehalatha, MD      . piperacillin-tazobactam (ZOSYN) IVPB 3.375 g  3.375 g Intravenous Q8H Albina BilletShanlever, Charles M, RPH 12.5 mL/hr at 03/31/18 0756 3.375 g at 03/31/18 0756  . senna-docusate (Senokot-S) tablet 1 tablet  1 tablet Oral QHS PRN Barbaraann RondoSridharan, Prasanna, MD      . thiamine (VITAMIN B-1) tablet 100 mg  100 mg Oral Daily Marjie SkiffSridharan, Prasanna, MD   100 mg at 03/31/18 1008   Or  . thiamine (B-1) injection 100 mg  100 mg Intravenous Daily Barbaraann RondoSridharan, Prasanna, MD   100 mg at 03/29/18 0951  . vancomycin (VANCOCIN) 1,500 mg in sodium chloride 0.9 % 500 mL IVPB  1,500 mg Intravenous Once Barbaraann RondoSridharan, Prasanna, MD   Stopped at 03/29/18 0934  . vancomycin (VANCOCIN) IVPB 1000 mg/200 mL premix  1,000 mg Intravenous Q8H Barbaraann RondoSridharan, Prasanna, MD 200 mL/hr at 03/31/18 1319 1,000 mg at 03/31/18 1319     Abtx:  Anti-infectives (From admission, onward)   Start     Dose/Rate Route Frequency Ordered Stop   03/30/18 1500  piperacillin-tazobactam (ZOSYN) IVPB 3.375 g     3.375 g 12.5 mL/hr over 240 Minutes Intravenous Every 8 hours 03/30/18 1328     03/30/18 1400  piperacillin-tazobactam (ZOSYN) IVPB 3.375 g  Status:  Discontinued     3.375 g 12.5 mL/hr over 240 Minutes Intravenous Every 8 hours 03/30/18 1306 03/30/18 1328   03/29/18 0515  vancomycin (VANCOCIN) IVPB 1000 mg/200 mL premix     1,000 mg 200 mL/hr over 60 Minutes Intravenous Every 8 hours 03/29/18 0512     03/29/18 0415  vancomycin (VANCOCIN) 1,500 mg in sodium chloride 0.9 % 500 mL IVPB     1,500 mg 250 mL/hr over 120 Minutes Intravenous  Once 03/29/18 0409        REVIEW OF SYSTEMS:  Const: Subjective fever, negative chills, negative weight loss Eyes:  negative diplopia or visual changes, negative eye pain ENT: negative coryza, negative sore throat Resp: Baseline cough, no hemoptysis, dyspnea Cards: Bilateral chest pain, no palpitations, lower extremity edema GU: negative for frequency, dysuria and hematuria GI: Negative for abdominal pain, diarrhea, bleeding, constipation Skin: Really bad inguinal and perineal hidradenitis Heme: negative for easy bruising and gum/nose bleeding MS: negative for myalgias, arthralgias, back pain and muscle weakness Neurolo:negative for headaches, dizziness, vertigo, memory problems  Psych: negative for feelings of anxiety, depression  Endocrine: No polyuria or polydipsia Allergy/Immunology-local itching to azithromycin and vancomycin during this hospitalization Objective:  VITALS:  BP (!) 105/58 (BP Location: Right Arm)   Pulse 81   Temp 98.3 F (36.8 C)   Resp 20   Ht 6' (1.829 m)   Wt 78 kg   SpO2 98%   BMI 23.31 kg/m  PHYSICAL EXAM:  General: Alert, cooperative, no distress, appears stated age. pale Head: Normocephalic, without obvious abnormality, atraumatic. Eyes: Conjunctivae clear, anicteric sclerae. Pupils are equal ENT Nares normal. No drainage or sinus tenderness. Lips, mucosa, and tongue normal. No Thrush Neck: Supple, symmetrical, no adenopathy, thyroid: non tender no carotid bruit and no JVD. Back: No CVA tenderness. Lungs: b/l air entry, crepts Heart: Regular rate and rhythm, no murmur, rub or gallop. Abdomen: Soft, non-tender,not distended. Bowel sounds normal. No masses Extremities: atraumatic, no cyanosis. No edema. No clubbing Skin: No  rashes or lesions. Or bruising Lymph: Cervical, supraclavicular normal. Neurologic: Grossly non-focal Inguinal/perineal area- severe hidradenitis suppurativa lesions- foul smelling discharge Pertinent Labs Lab Results CBC    Component Value Date/Time   WBC 25.3 (H) 03/31/2018 0428   RBC 4.38 03/31/2018 0428   HGB 11.8 (L) 03/31/2018  0428   HGB 14.1 10/04/2012 1820   HCT 35.8 (L) 03/31/2018 0428   HCT 43.8 10/04/2012 1820   PLT 263 03/31/2018 0428   PLT 335 10/04/2012 1820   MCV 81.7 03/31/2018 0428   MCV 87 10/04/2012 1820   MCH 26.9 03/31/2018 0428   MCHC 33.0 03/31/2018 0428   RDW 15.8 (H) 03/31/2018 0428   RDW 14.9 (H) 10/04/2012 1820   LYMPHSABS 2.2 11/24/2017 2145   LYMPHSABS 1.4 10/04/2012 1820   MONOABS 1.8 (H) 11/24/2017 2145   MONOABS 1.5 (H) 10/04/2012 1820   EOSABS 0.2 11/24/2017 2145   EOSABS 0.1 10/04/2012 1820   BASOSABS 0.0 11/24/2017 2145   BASOSABS 0.1 10/04/2012 1820   CBC Latest Ref Rng & Units 03/31/2018 03/30/2018 03/28/2018  WBC 4.0 - 10.5 K/uL 25.3(H) 21.0(H) 11.7(H)  Hemoglobin 13.0 - 17.0 g/dL 11.8(L) 12.5(L) 13.4  Hematocrit 39.0 - 52.0 % 35.8(L) 37.4(L) 40.9  Platelets 150 - 400 K/uL 263 282 346   CMP Latest Ref Rng & Units 03/31/2018 03/30/2018 03/28/2018  Glucose 70 - 99 mg/dL 99 580(D) 85  BUN 6 - 20 mg/dL 12 9 11   Creatinine 0.61 - 1.24 mg/dL 9.83 3.82 5.05  Sodium 135 - 145 mmol/L 132(L) 133(L) 135  Potassium 3.5 - 5.1 mmol/L 3.9 2.8(L) 3.5  Chloride 98 - 111 mmol/L 104 103 102  CO2 22 - 32 mmol/L 21(L) 22 27  Calcium 8.9 - 10.3 mg/dL 7.9(L) 7.9(L) 8.3(L)  Total Protein 6.5 - 8.1 g/dL - - 9.4(H)  Total Bilirubin 0.3 - 1.2 mg/dL - - 0.8  Alkaline Phos 38 - 126 U/L - - 213(H)  AST 15 - 41 U/L - - 29  ALT 0 - 44 U/L - - 16      Microbiology: Recent Results (from the past 240 hour(s))  Blood Culture (routine x 2)     Status: None (Preliminary result)   Collection Time: 03/29/18  3:42 AM  Result Value Ref Range Status   Specimen Description BLOOD RAC  Final   Special Requests   Final    BOTTLES DRAWN AEROBIC AND ANAEROBIC Blood Culture adequate volume   Culture   Final    NO GROWTH 2 DAYS Performed at [facility], [address] [place], [place] [address]    Report Status PENDING  Incomplete  Blood Culture (routine x 2)     Status: None (Preliminary result)    Collection Time: 03/29/18  3:42 AM  Result Value Ref Range Status   Specimen Description BLOOD LAC  Final   Special Requests   Final    BOTTLES DRAWN AEROBIC AND ANAEROBIC Blood Culture results may not be optimal due to an excessive volume of blood received in culture bottles   Culture   Final    NO GROWTH 2 DAYS Performed at [facility], [address] [place], [place] [address]    Report Status PENDING  Incomplete  Blood culture (routine single)     Status: None (Preliminary result)   Collection Time: 03/29/18  3:42 AM  Result Value Ref Range Status   Specimen Description BLOOD L HAND  Final   Special Requests   Final  BOTTLES DRAWN AEROBIC AND ANAEROBIC Blood Culture adequate volume   Culture   Final    NO GROWTH 2 DAYS Performed at [facility], [address] [place], [place] [address]    Report Status PENDING  Incomplete   IMAGING RESULTS: ?  Impression/Recommendation 48 y.o. male with a history of hidradenitis suppurativa on Remicade presents to the hospital with chest pain.  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  On Friday he had gone to get his physical with his primary care doctor and had told her that he had felt some chest pain on the left side.  She examined him and ruled out any cardiovascular event.  Over the weekend the pain got worse and it was on present on the right side as well and whenever he took a deep breath it was hurting him.  So he came to the hospital.  He was also having subjective fever.  He has a baseline cough because of smoking but that did not increase he says.  He did not have any hemoptysis.   Bilateral pulmonary nodules in a patient who is immunocompromised with tumor necrosis factor inhibitor Remicade , who is symptomatic with pleuritic chest pain.    I am not sure this is septic emboli especially with him not having any risk factors like IV drug use or recent Lemierre's syndrome unless it is a marantic  thrombus on the valve.  More concerned that this could be some sort of an opportunistic infection especially with him being on a tumor necrosis factor inhibitor we need to rule out nocardia, fungal infection including histo, cocci or blasto even though he has no specific epidemiological risk.   Could very well be an atypical community-acquired pneumonia like Legionella   Doubt this is tuberculosis. ? ? ?Currently on Vanco and Zosyn.  We will change the latter to Levaquin to cover atypical bacteria. Get MRSA nares and if neg we can stop vancomycin  Worsening leucocytosis- may need repeat imaging, possible bronch, would get pulmonary involved  Hidradenitis suppurativa- gets Infliximab ( remicade) at [facility]  Increase in creatinine- avoid zosyn/vanco combo ___________________________________________________ Discussed with patient in detail Note:  This document was prepared using Dragon voice recognition software and may include unintentional dictation errors.

## 2018-03-31 NOTE — Progress Notes (Signed)
Pharmacy Antibiotic Note  Benjamin Owens is a 48 y.o. male admitted on 03/29/2018 with pneumonia.  Pharmacy has been consulted for vancomycin/zosyn dosing. Patient w/ h/o IV drug abuse arrives for CP and abdominal pain. Patient also appears to have some splintering on finger nails and possible janeway lesions concerning for bacteremia/endocarditis. Patient received vanc 1.5g IV x 1 in ED.  Plan:  Zosyn: Start Zosyn 3.375g q8h (extended infusion) - Consult for sepsis   Vancomycin: Will continue vanc 1g IV q8h w/ 6 hour stack  Trough drawn 01/07 @ 1330 prior to 4th dose - 16 - at goal  Ke 0.0938 T1/2 8 hrs  Goal trough 15 - 20 mcg/mL  03/29/18-  Patient with itching/redness and Vancomycin dose in am was stopped. Per discussion with MD will add Benadryl prior to Vancomycin doses for now and continue Vancomycin per MD. F/u Blood cx  1/8 :  VT @ 2130 = 15 mcg/mL Will continue pt on Vanc  1 gm IV Q8H.      Height: 6' (182.9 cm) Weight: 171 lb 14.4 oz (78 kg) IBW/kg (Calculated) : 77.6  Temp (24hrs), Avg:98.2 F (36.8 C), Min:98 F (36.7 C), Max:98.3 F (36.8 C)  Recent Labs  Lab 03/28/18 2316 03/29/18 0342 03/30/18 0636 03/30/18 1330 03/31/18 0428 03/31/18 2122  WBC 11.7*  --  21.0*  --  25.3*  --   CREATININE 0.92  --  0.94  --  1.12  --   LATICACIDVEN  --  0.7  --   --   --   --   VANCOTROUGH  --   --   --  16  --  15    Estimated Creatinine Clearance: 88.5 mL/min (by C-G formula based on SCr of 1.12 mg/dL).    Allergies  Allergen Reactions  . Vancomycin Itching    Itching at IV site and redness  . Azithromycin Itching    Pt arm itching after receiving IV Zithromax   Thank you for allowing pharmacy to be a part of this patient's care.  Scherrie Gerlachobbins,Asherah Lavoy D, PharmD, BCPS Clinical Pharmacist 03/31/2018 10:17 PM

## 2018-03-31 NOTE — Consult Note (Signed)
PHARMACY CONSULT NOTE - FOLLOW UP  Pharmacy Consult for Electrolyte Monitoring and Replacement   Recent Labs: Potassium (mmol/L)  Date Value  03/31/2018 3.9  10/04/2012 4.3   Magnesium (mg/dL)  Date Value  53/20/2334 2.0   Calcium (mg/dL)  Date Value  35/68/6168 7.9 (L)   Calcium, Total (mg/dL)  Date Value  37/29/0211 9.2   Albumin (g/dL)  Date Value  15/52/0802 3.1 (L)  12/28/2017 3.1 (L)   Phosphorus (mg/dL)  Date Value  23/36/1224 2.5   Sodium (mmol/L)  Date Value  03/31/2018 132 (L)  12/28/2017 137  10/04/2012 139     Assessment: Pharmacy has been consulted to replenish and monitor electrolytes  Goal of Therapy:  Electrolytes wnl's  Plan:  Will d/c scheduled potassium - K and Mg @ goal  Recheck electrolytes with am labs  Albina Billet, PharmD, BCPS Clinical Pharmacist 03/31/2018 8:11 AM

## 2018-04-01 ENCOUNTER — Encounter: Payer: Self-pay | Admitting: *Deleted

## 2018-04-01 DIAGNOSIS — R0781 Pleurodynia: Secondary | ICD-10-CM

## 2018-04-01 LAB — CBC
HCT: 36.4 % — ABNORMAL LOW (ref 39.0–52.0)
Hemoglobin: 12.1 g/dL — ABNORMAL LOW (ref 13.0–17.0)
MCH: 27.4 pg (ref 26.0–34.0)
MCHC: 33.2 g/dL (ref 30.0–36.0)
MCV: 82.4 fL (ref 80.0–100.0)
Platelets: 279 10*3/uL (ref 150–400)
RBC: 4.42 MIL/uL (ref 4.22–5.81)
RDW: 15.5 % (ref 11.5–15.5)
WBC: 15.4 10*3/uL — ABNORMAL HIGH (ref 4.0–10.5)
nRBC: 0 % (ref 0.0–0.2)

## 2018-04-01 LAB — BASIC METABOLIC PANEL
ANION GAP: 6 (ref 5–15)
BUN: 10 mg/dL (ref 6–20)
CO2: 22 mmol/L (ref 22–32)
Calcium: 8 mg/dL — ABNORMAL LOW (ref 8.9–10.3)
Chloride: 103 mmol/L (ref 98–111)
Creatinine, Ser: 0.94 mg/dL (ref 0.61–1.24)
GFR calc Af Amer: >60 mL/min (ref >60)
GFR calc non Af Amer: >60 mL/min (ref >60)
Glucose, Bld: 109 mg/dL — ABNORMAL HIGH (ref 70–99)
POTASSIUM: 3.5 mmol/L (ref 3.5–5.1)
Sodium: 131 mmol/L — ABNORMAL LOW (ref 135–145)

## 2018-04-01 MED ORDER — POTASSIUM CHLORIDE CRYS ER 20 MEQ PO TBCR
20.0000 meq | EXTENDED_RELEASE_TABLET | Freq: Once | ORAL | Status: AC
  Administered 2018-04-01: 20 meq via ORAL
  Filled 2018-04-01: qty 1

## 2018-04-01 NOTE — Progress Notes (Signed)
[facility] [place] [facility]   PATIENT NAME: Benjamin Owens    MR#:  [id number]  DATE OF BIRTH:  04-25-1970  SUBJECTIVE: Seen at bedside, says he is feeling little better, less chest pain.  CHIEF COMPLAINT:   Chief Complaint  Patient presents with  . Chest Pain  . Abdominal Pain    REVIEW OF SYSTEMS:   ROS CONSTITUTIONAL: No fever, fatigue or weakness.  EYES: No blurred or double vision.  EARS, NOSE, AND THROAT: No tinnitus or ear pain.  RESPIRATORY: Has some dry cough. CARDIOVASCULAR: No chest pain, orthopnea, edema.  GASTROINTESTINAL: No nausea, vomiting, diarrhea or abdominal pain.  GENITOURINARY: No dysuria, hematuria.  ENDOCRINE: No polyuria, nocturia,  HEMATOLOGY: No anemia, easy bruising or bleeding SKIN: No rash or lesion. MUSCULOSKELETAL: No joint pain or arthritis.   NEUROLOGIC: No tingling, numbness, weakness.  PSYCHIATRY: No anxiety or depression.   DRUG ALLERGIES:   Allergies  Allergen Reactions  . Vancomycin Itching    Itching at IV site and redness  . Azithromycin Itching    Pt arm itching after receiving IV Zithromax    VITALS:  Blood pressure 112/82, pulse 70, temperature 98.3 F (36.8 C), temperature source Oral, resp. rate 18, height 6' (1.829 m), weight 77 kg, SpO2 97 %.  PHYSICAL EXAMINATION:  GENERAL:  48 y.o.-year-old patient lying in the bed with no acute distress.  EYES: Pupils equal, round, reactive to light and accommodation. No scleral icterus. Extraocular muscles intact.  HEENT: Head atraumatic, normocephalic. Oropharynx and nasopharynx clear.  NECK:  Supple, no jugular venous distention. No thyroid enlargement, no tenderness.  LUNGS: Normal breath sounds bilaterally, no wheezing, rales,rhonchi or crepitation. No use of accessory muscles of respiration.  CARDIOVASCULAR: S1, S2 normal. No murmurs, rubs, or gallops.  ABDOMEN: Soft, nontender, nondistended. Bowel sounds present. No organomegaly or mass.   EXTREMITIES: No pedal edema, cyanosis, or clubbing.  NEUROLOGIC: Cranial nerves II through XII are intact. Muscle strength 5/5 in all extremities. Sensation intact. Gait not checked.  PSYCHIATRIC: The patient is alert and oriented x 3.  SKIN: No obvious rash, lesion, or ulcer.    LABORATORY PANEL:   CBC Recent Labs  Lab 04/01/18 0706  WBC 15.4*  HGB 12.1*  HCT 36.4*  PLT 279   ------------------------------------------------------------------------------------------------------------------  Chemistries  Recent Labs  Lab 03/28/18 2316  03/31/18 0428 04/01/18 0706  NA 135   < > 132* 131*  K 3.5   < > 3.9 3.5  CL 102   < > 104 103  CO2 27   < > 21* 22  GLUCOSE 85   < > 99 109*  BUN 11   < > 12 10  CREATININE 0.92   < > 1.12 0.94  CALCIUM 8.3*   < > 7.9* 8.0*  MG  --    < > 2.0  --   AST 29  --   --   --   ALT 16  --   --   --   ALKPHOS 213*  --   --   --   BILITOT 0.8  --   --   --    < > = values in this interval not displayed.   ------------------------------------------------------------------------------------------------------------------  Cardiac Enzymes Recent Labs  Lab 03/28/18 2316  TROPONINI <0.03   ------------------------------------------------------------------------------------------------------------------  RADIOLOGY:  No results found.  EKG:   Orders placed or performed during the hospital encounter of 03/29/18  . EKG 12-Lead  . EKG 12-Lead  .  ED EKG within 10 minutes  . ED EKG within 10 minutes    ASSESSMENT AND PLAN:  48 year old male with history of hidradenitis suppurativa on Remicade comes in because of chest pain, cough, bilateral pulmonary nodules on CT chest, seen by Dr. Rudene Andaavi Shanker, patient is on Zosyn, Levaquin, recommends pulmonary consult to evaluate for possible upper chest tach infection like nocardia, fungal infections, histo, cocci, blasto, patient needs bronchoscopy.  He says his chest pain, cough better.  WBC also  down.    #2., acute kidney injury: Improved.  All the records are reviewed and case discussed with Care Management/Social Workerr. Management plans discussed with the patient, family and they are in agreement.  CODE STATUS: Full code  TOTAL TIME TAKING CARE OF THIS PATIENT: 38 minutes.   POSSIBLE D/C IN 1-2 DAYS, DEPENDING ON CLINICAL CONDITION.   Katha HammingSnehalatha Aileen Amore M.D on 04/01/2018 at 10:38 AM  Between 7am to 6pm - Pager - [phone]  After 6pm go to [email or web address] - password [email or web address]  [facility]  Office  [phone]  CC: Primary care physician; Galen ManilaKennedy, Lauren Renee, NP   Note: This dictation was prepared with Dragon dictation along with smaller phrase technology. Any transcriptional errors that result from this process are unintentional.

## 2018-04-01 NOTE — Consult Note (Signed)
PHARMACY CONSULT NOTE - FOLLOW UP  Pharmacy Consult for Electrolyte Monitoring and Replacement   Recent Labs: Potassium (mmol/L)  Date Value  04/01/2018 3.5  10/04/2012 4.3   Magnesium (mg/dL)  Date Value  16/10/960401/10/2018 2.0   Calcium (mg/dL)  Date Value  54/09/811901/11/2018 8.0 (L)   Calcium, Total (mg/dL)  Date Value  14/78/295607/14/2014 9.2   Albumin (g/dL)  Date Value  21/30/865701/07/2018 3.1 (L)  12/28/2017 3.1 (L)   Phosphorus (mg/dL)  Date Value  84/69/629501/08/2018 2.5   Sodium (mmol/L)  Date Value  04/01/2018 131 (L)  12/28/2017 137  10/04/2012 139     Assessment: Pharmacy has been consulted to replenish and monitor electrolytes  Goal of Therapy:  K ~ 4  Plan:  K 3.5 - Give KCl 20 meq x 1   Recheck potassium with am labs  Benjamin Owens, PharmD, BCPS Clinical Pharmacist 04/01/2018 7:51 AM

## 2018-04-01 NOTE — Progress Notes (Signed)
Date of Admission:  03/29/2018     ID: Benjamin Owens is a 48 y.o. male  Active Problems:   Chest pain    Subjective: Feeling the same- no worse Appetite fair Some pleuritic chest pain  Medications:  . enoxaparin (LOVENOX) injection  40 mg Subcutaneous Q24H  . folic acid  1 mg Oral Daily  . LORazepam  0-4 mg Intravenous Q12H  . multivitamin with minerals  1 tablet Oral Daily  . thiamine  100 mg Oral Daily   Or  . thiamine  100 mg Intravenous Daily    Objective: Vital signs in last 24 hours: Temp:  [98 F (36.7 C)-99.1 F (37.3 C)] 99.1 F (37.3 C) (01/09 1914) Pulse Rate:  [70-74] 73 (01/09 1914) Resp:  [18] 18 (01/09 1914) BP: (112-122)/(81-90) 113/81 (01/09 1914) SpO2:  [97 %-99 %] 98 % (01/09 1914) Weight:  [77 kg] 77 kg (01/09 0510)  PHYSICAL EXAM:  General: Alert, cooperative, no distress, appears stated age.  Head: Normocephalic, without obvious abnormality, atraumatic. Eyes: Conjunctivae clear, anicteric sclerae. Pupils are equal ENT Nares normal. No drainage or sinus tenderness. Lips, mucosa, and tongue normal. No Thrush Neck: Supple, symmetrical, no adenopathy, thyroid: non tender no carotid bruit and no JVD. Back: No CVA tenderness. Lungs: Clear to auscultation bilaterally. No Wheezing or Rhonchi. No rales. Heart: Regular rate and rhythm, no murmur, rub or gallop. Abdomen: Soft, non-tender,not distended. Bowel sounds normal. No masses Extremities: atraumatic, no cyanosis. No edema. No clubbing Skin: No rashes or lesions. Or bruising Lymph: Cervical, supraclavicular normal. Neurologic: Grossly non-focal  Lab Results Recent Labs    03/31/18 0428 04/01/18 0706  WBC 25.3* 15.4*  HGB 11.8* 12.1*  HCT 35.8* 36.4*  NA 132* 131*  K 3.9 3.5  CL 104 103  CO2 21* 22  BUN 12 10  CREATININE 1.12 0.94   Liver Panel No results for input(s): PROT, ALBUMIN, AST, ALT, ALKPHOS, BILITOT, BILIDIR, IBILI in the last 72 hours. Sedimentation Rate No  results for input(s): ESRSEDRATE in the last 72 hours. C-Reactive Protein No results for input(s): CRP in the last 72 hours.  Microbiology:  Studies/Results: No results found.   Assessment/Plan: Impression/Recommendation 48 y.o. male with a history of hidradenitis suppurativa on Remicade presents to the hospital with chest pain.  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  On Friday he had gone to get his physical with his primary care doctor and had told her that he had felt some chest pain on the left side.  She examined him and ruled out any cardiovascular event.  Over the weekend the pain got worse and it was on present on the right side as well and whenever he took a deep breath it was hurting him.  So he came to the hospital.  He was also having subjective fever.  He has a baseline cough because of smoking but that did not increase he says.  He did not have any hemoptysis.   Bilateral pulmonary nodules in a patient who is immunocompromised with tumor necrosis factor inhibitor Remicade , who is symptomatic with pleuritic chest pain.    I am not sure this is septic emboli especially with him not having any risk factors like IV drug use or recent Lemierre's syndrome unless it is a marantic thrombus on the valve.  More concerned that this could be some sort of an opportunistic infection especially with him being on a tumor necrosis factor inhibitor we need to rule  out nocardia, fungal infection including histo, cocci or blasto even though he has no specific epidemiological risk.   Could very well be an atypical community-acquired pneumonia like Legionella   Doubt this is tuberculosis. ? ? ?Currently on Vanco and levaquin. As MRSA nares neg we can stop vancomycin. levaquin for another 5 days  Worsening leucocytosis- has improved may need repeat imaging in a few weeks  Many tests have been sent- they are pending  Hidradenitis suppurativa- gets Infliximab ( remicade) at  [facility]  Increase in creatinine- avoid zosyn/vanco combo Improving now  Discussed the management with patient and hospitalist

## 2018-04-01 NOTE — Plan of Care (Signed)
  Problem: Clinical Measurements: Goal: Respiratory complications will improve Outcome: Progressing   Problem: Activity: Goal: Risk for activity intolerance will decrease Outcome: Progressing Note:  Up independently in room   Problem: Coping: Goal: Level of anxiety will decrease Outcome: Progressing Note:  CIWA negative, but did need ativan for insomnia last night   Problem: Elimination: Goal: Will not experience complications related to urinary retention Outcome: Progressing   Problem: Safety: Goal: Ability to remain free from injury will improve Outcome: Progressing   Problem: Pain Managment: Goal: General experience of comfort will improve Outcome: Not Progressing Note:  Continues to have rib pain, treating with percocet twice this shift, which relief   Problem: Education: Goal: Knowledge of General Education information will improve Description Including pain rating scale, medication(s)/side effects and non-pharmacologic comfort measures Outcome: Completed/Met   Problem: Nutrition: Goal: Adequate nutrition will be maintained Outcome: Completed/Met

## 2018-04-02 LAB — CBC
HCT: 38.2 % — ABNORMAL LOW (ref 39.0–52.0)
Hemoglobin: 12.8 g/dL — ABNORMAL LOW (ref 13.0–17.0)
MCH: 27.1 pg (ref 26.0–34.0)
MCHC: 33.5 g/dL (ref 30.0–36.0)
MCV: 80.9 fL (ref 80.0–100.0)
PLATELETS: 289 10*3/uL (ref 150–400)
RBC: 4.72 MIL/uL (ref 4.22–5.81)
RDW: 15.4 % (ref 11.5–15.5)
WBC: 11.1 10*3/uL — ABNORMAL HIGH (ref 4.0–10.5)
nRBC: 0 % (ref 0.0–0.2)

## 2018-04-02 LAB — FUNGITELL, SERUM: Fungitell Result: <31 pg/mL (ref <80)

## 2018-04-02 LAB — CREATININE, SERUM
CREATININE: 0.95 mg/dL (ref 0.61–1.24)
GFR calc Af Amer: >60 mL/min (ref >60)
GFR calc non Af Amer: >60 mL/min (ref >60)

## 2018-04-02 LAB — TOXOPLASMA ANTIBODIES- IGG AND  IGM: Toxoplasma Antibody- IgM: <3 AU/mL (ref 0.0–7.9)

## 2018-04-02 LAB — POTASSIUM: Potassium: 3.7 mmol/L (ref 3.5–5.1)

## 2018-04-02 LAB — LEGIONELLA PNEUMOPHILA SEROGP 1 UR AG: L. pneumophila Serogp 1 Ur Ag: NEGATIVE

## 2018-04-02 LAB — CRYPTOCOCCUS ANTIGEN, SERUM: Cryptococcus Antigen, Serum: NEGATIVE

## 2018-04-02 LAB — HIV ANTIBODY (ROUTINE TESTING W REFLEX): HIV Screen 4th Generation wRfx: NONREACTIVE

## 2018-04-02 MED ORDER — LEVOFLOXACIN 750 MG PO TABS: 750.0000 mg | ORAL_TABLET | Freq: Every day | ORAL | 0 refills | Status: AC

## 2018-04-02 NOTE — Discharge Planning (Signed)
Patient IV and tele removed. RN assessment and VS revealed stability for DC to home.  DC instructions printed, given, explained and educated. Informed of suggested FU appt and appt set.  Patient understands he need referral from PCP for [facility] pulm F/U (D/T insurance requirements).  Patinet has not had any narcotics today. Once ready, will be walked to front and driving himself home.

## 2018-04-02 NOTE — Discharge Summary (Signed)
Benjamin Owens, is a 48 y.o. male  DOB [birth year]  MRN [id number].  Admission date:  03/29/2018  Admitting Physician  Barbaraann Rondo, MD  Discharge Date:  04/02/2018   Primary MD  Galen Manila, NP  Recommendations for primary care physician for things to follow:   Follow with PCP in 1 week, follow-up with primary dermatology at [facility] as scheduled.   Admission Diagnosis  Pleuritic chest pain [R07.81] Septic embolism [facility]) [I76]   Discharge Diagnosis  Pleuritic chest pain [R07.81] Septic embolism (HCC) [I76]    Active Problems:   Chest pain      Past Medical History:  Diagnosis Date  . Hay fever   . Hidradenitis suppurativa     Past Surgical History:  Procedure Laterality Date  . NO PAST SURGERIES         History of present illness and  Hospital Course:     Kindly see H&P for history of present illness and admission details, please review complete Labs, Consult reports and Test reports for all details in brief  HPI  from the history and physical done on the day of admission 48 year old male patient history of hidradenitis suppurativa on Remicade comes in because of chest pain, shortness of breath, cough, found to have bilateral pulmonary opacities on CT chest and admitted for chest pain, CT findings.   Hospital Course  #1 .left-sided chest pain, EKG, troponins are negative, ruled out for MI.  #2. bilateral pulmonary nodules, patient had chest pain, shortness of breath, unable to take deep breath, patient started on vancomycin, Zosyn, seen by Dr. Rudene Anda because of abnormal CT chest, leukocytosis WBC elevated to 25, because patient is on Remicade for his hidradenitis separative he is at high risk for opportunistic infection, initially referred for pulmonary consult for bronchoscopy but  patient chest pain, shortness of breath improved after starting the Levaquin, patient MRSA screen negative so vancomycin stopped, go cytosis also is resolving, WBC today is 11.1 patient is eager to go home, decrease chest pain, shortness of breath, so spoke to Dr. Rudene Anda recommended Levaquin for 5 days, patient can see dermatologist at [facility] and also have him referred to pulmonary at [facility].  #3. hidradenitis separative a, patient is on oral clindamycin, Remicade infusions.  Can continue oral clindamycin #4. history of abnormal LFTs, patient followed up with Dr. Wyline Mood.  She of alcohol abuse, initially placed on CIWA protocol.  Advised to quit alcohol. Discharge Condition: Stable   Follow UP  Follow-up Information    Galen Manila, NP. Schedule an appointment as soon as possible for a visit in 1 week(s).   Specialty:  Nurse Practitioner Contact information: [address] [place] [address] [phone]        [facility] Follow up.   Contact information: as scheduled            Discharge Instructions  and  Discharge Medications      Allergies as of 04/02/2018      Reactions   Vancomycin Itching   Itching at IV site and redness (pt tolerated with IV benadryl)   Azithromycin Itching   Pt arm itching after receiving IV Zithromax      Medication List    TAKE these medications   clindamycin 300 MG capsule Commonly known as:  CLEOCIN Take 300 mg by mouth 3 (three) times daily.   gabapentin 300 MG capsule Commonly known as:  NEURONTIN Take 300 mg by mouth as needed.  inFLIXimab 100 MG injection Commonly known as:  REMICADE Inject into the vein.   levofloxacin 750 MG tablet Commonly known as:  LEVAQUIN Take 1 tablet (750 mg total) by mouth daily for 5 days.   varenicline 0.5 MG X 11 & 1 MG X 42 tablet Commonly known as:  CHANTIX STARTING MONTH PAK Take one 0.5 mg tab by mouth once daily for 3 days, then take one 0.5 mg tab twice daily for 4 days,  then take one 1 mg tab twice daily.         Diet and Activity recommendation: See Discharge Instructions above   Consults obtained -ID   Major procedures and Radiology Reports - PLEASE review detailed and final reports for all details, in brief -      Dg Chest 2 View  Result Date: 03/28/2018 CLINICAL DATA:  Sharp pain in the left chest with movement and breathing. EXAM: CHEST - 2 VIEW COMPARISON:  11/24/2017 FINDINGS: Borderline cardiomegaly. Nonaneurysmal thoracic aorta. Faint subsegmental pulmonary opacities project over the lingula and both lower lobes suspicious for atelectasis and/or pneumonia. Minimal blunting the lateral costophrenic angles may represent trace pleural effusions or pleural thickening. No overt pulmonary edema. Degenerative change is noted along dorsal spine. IMPRESSION: Faint airspace opacities in the lingula and both lower lobes, suspicious for atelectasis and/or pneumonia. Electronically Signed   By: Tollie Ethavid  Kwon M.D.   On: 03/28/2018 23:38   Ct Angio Chest Pe W/cm &/or Wo Cm  Result Date: 03/29/2018 CLINICAL DATA:  Sharp pain in the left with movement and deep breaths. The suspected. EXAM: CT ANGIOGRAPHY CHEST WITH CONTRAST TECHNIQUE: Multidetector CT imaging of the chest was performed using the standard protocol during bolus administration of intravenous contrast. Multiplanar CT image reconstructions and MIPs were obtained to evaluate the vascular anatomy. CONTRAST:  100mL OMNIPAQUE IOHEXOL 350 MG/ML SOLN COMPARISON:  Same day radiographs FINDINGS: Cardiovascular: Satisfactory opacification of the pulmonary arteries to the segmental level. No evidence of pulmonary embolism. Normal heart size. No pericardial effusion. Uncoiled appearance of the thoracic aorta without aneurysm or dissection. Mild ectasia of the ascending thoracic aorta to 3.7 cm. Mediastinum/Nodes: No enlarged mediastinal, hilar, or axillary lymph nodes. Thyroid gland, trachea, and esophagus demonstrate  no significant findings. Lungs/Pleura: Bilateral multilobar pulmonary opacities, one with possible faint cavitation in the lingula, series 6/55 raise concern for septic emboli. Tiny subpleural nodules in the lingula are also noted measuring between 1 and 4.8 mm. No effusion or pneumothorax. Upper Abdomen: No acute abnormality. Musculoskeletal: No chest wall abnormality. No acute or significant osseous findings. Review of the MIP images confirms the above findings. IMPRESSION: 1. No acute pulmonary embolus. 2. Bilateral multilobar pulmonary opacities, one with possible faint cavitation in the lingula raise concern for possible septic emboli. Neoplastic etiologies be less likely. 3. Tiny subpleural nodules in the lingula measuring between 1 and 4.8 mm are identified. No follow-up needed if patient is low-risk (and has no known or suspected primary neoplasm). Non-contrast chest CT can be considered in 12 months if patient is high-risk. This recommendation follows the consensus statement: Guidelines for Management of Incidental Pulmonary Nodules Detected on CT Images: From the Fleischner Society 2017; Radiology 2017; 284:228-243. Electronically Signed   By: Tollie Ethavid  Kwon M.D.   On: 03/29/2018 03:13    Micro Results    Recent Results (from the past 240 hour(s))  Blood Culture (routine x 2)     Status: None (Preliminary result)   Collection Time: 03/29/18  3:42 AM  Result Value  Ref Range Status   Specimen Description BLOOD RAC  Final   Special Requests   Final    BOTTLES DRAWN AEROBIC AND ANAEROBIC Blood Culture adequate volume   Culture   Final    NO GROWTH 4 DAYS Performed at [facility], [address], [place], [place] [address]    Report Status PENDING  Incomplete  Blood Culture (routine x 2)     Status: None (Preliminary result)   Collection Time: 03/29/18  3:42 AM  Result Value Ref Range Status   Specimen Description BLOOD LAC  Final   Special Requests   Final    BOTTLES DRAWN  AEROBIC AND ANAEROBIC Blood Culture results may not be optimal due to an excessive volume of blood received in culture bottles   Culture   Final    NO GROWTH 4 DAYS Performed at [facility], [address], [place], [place] [address]    Report Status PENDING  Incomplete  Blood culture (routine single)     Status: None (Preliminary result)   Collection Time: 03/29/18  3:42 AM  Result Value Ref Range Status   Specimen Description BLOOD L HAND  Final   Special Requests   Final    BOTTLES DRAWN AEROBIC AND ANAEROBIC Blood Culture adequate volume   Culture   Final    NO GROWTH 4 DAYS Performed at [facility], [address] [place], [place] [address]    Report Status PENDING  Incomplete  MRSA PCR Screening     Status: None   Collection Time: 03/31/18  6:20 PM  Result Value Ref Range Status   MRSA by PCR NEGATIVE NEGATIVE Final    Comment:        The GeneXpert MRSA Assay (FDA approved for NASAL specimens only), is one component of a comprehensive MRSA colonization surveillance program. It is not intended to diagnose MRSA infection nor to guide or monitor treatment for MRSA infections. Performed at [facility], [address] [place], [place] [address]        Today   Subjective:   Benjamin Owens today has no chest pain, shortness of breath, able to take deep breaths today.  Stable for discharge home.  Objective:   Blood pressure (!) 129/92, pulse 69, temperature 98.2 F (36.8 C), temperature source Oral, resp. rate 18, height 6' (1.829 m), weight 75.3 kg, SpO2 99 %.   Intake/Output Summary (Last 24 hours) at 04/02/2018 0930 Last data filed at 04/02/2018 0902 Gross per 24 hour  Intake 631.24 ml  Output 1750 ml  Net -1118.76 ml    Exam Awake Alert, Oriented x 3, No new F.N deficits, Normal affect .AT,PERRAL Supple Neck,No JVD, No cervical lymphadenopathy appriciated.  Symmetrical Chest wall movement, Good air movement  bilaterally, CTAB RRR,No Gallops,Rubs or new Murmurs, No Parasternal Heave +ve B.Sounds, Abd Soft, Non tender, No organomegaly appriciated, No rebound -guarding or rigidity. No Cyanosis, Clubbing or edema, No new Rash or bruise  Data Review   CBC w Diff:  Lab Results  Component Value Date   WBC 11.1 (H) 04/02/2018   HGB 12.8 (L) 04/02/2018   HGB 14.1 10/04/2012   HCT 38.2 (L) 04/02/2018   HCT 43.8 10/04/2012   PLT 289 04/02/2018   PLT 335 10/04/2012   LYMPHOPCT 19 11/24/2017   LYMPHOPCT 10.4 10/04/2012   MONOPCT 16 11/24/2017   MONOPCT 10.7 10/04/2012   EOSPCT 1 11/24/2017   EOSPCT 1.1 10/04/2012   BASOPCT 0 11/24/2017   BASOPCT  0.5 10/04/2012    CMP:  Lab Results  Component Value Date   NA 131 (L) 04/01/2018   NA 137 12/28/2017   NA 139 10/04/2012   K 3.7 04/02/2018   K 4.3 10/04/2012   CL 103 04/01/2018   CL 105 10/04/2012   CO2 22 04/01/2018   CO2 31 10/04/2012   BUN 10 04/01/2018   BUN 8 12/28/2017   BUN 12 10/04/2012   CREATININE 0.95 04/02/2018   CREATININE 0.93 10/04/2012   PROT 9.4 (H) 03/28/2018   PROT 8.4 12/28/2017   ALBUMIN 3.1 (L) 03/28/2018   ALBUMIN 3.1 (L) 12/28/2017   BILITOT 0.8 03/28/2018   BILITOT 0.5 12/28/2017   ALKPHOS 213 (H) 03/28/2018   AST 29 03/28/2018   ALT 16 03/28/2018  .   Total Time in preparing paper work, data evaluation and todays exam - 35 minutes  Katha Hamming M.D on 04/02/2018 at 9:30 AM    Note: This dictation was prepared with Dragon dictation along with smaller phrase technology. Any transcriptional errors that result from this process are unintentional.

## 2018-04-02 NOTE — Plan of Care (Signed)
  Problem: Clinical Measurements: Goal: Respiratory complications will improve Outcome: Progressing   Problem: Activity: Goal: Risk for activity intolerance will decrease Outcome: Progressing Note:  Up independently.   Problem: Coping: Goal: Level of anxiety will decrease Outcome: Progressing   Problem: Elimination: Goal: Will not experience complications related to bowel motility Outcome: Progressing Goal: Will not experience complications related to urinary retention Outcome: Progressing   Problem: Safety: Goal: Ability to remain free from injury will improve Outcome: Progressing   Problem: Skin Integrity: Goal: Risk for impaired skin integrity will decrease Outcome: Progressing   Problem: Pain Managment: Goal: General experience of comfort will improve Outcome: Not Progressing Note:  Treated rib cage pain once with oxycodone.

## 2018-04-02 NOTE — Consult Note (Signed)
PHARMACY CONSULT NOTE - FOLLOW UP  Pharmacy Consult for Electrolyte Monitoring and Replacement   Recent Labs: Potassium (mmol/L)  Date Value  04/02/2018 3.7  10/04/2012 4.3   Magnesium (mg/dL)  Date Value  54/00/8676 2.0   Calcium (mg/dL)  Date Value  19/50/9326 8.0 (L)   Calcium, Total (mg/dL)  Date Value  71/24/5809 9.2   Albumin (g/dL)  Date Value  98/33/8250 3.1 (L)  12/28/2017 3.1 (L)   Phosphorus (mg/dL)  Date Value  53/97/6734 2.5   Sodium (mmol/L)  Date Value  04/01/2018 131 (L)  12/28/2017 137  10/04/2012 139     Assessment: Pharmacy has been consulted to replenish and monitor electrolytes  Goal of Therapy:  K ~ 4  Plan:  No replenishment warranted at this time  Recheck potassium with am labs (if wnl's d/c consult)  Albina Billet, PharmD, BCPS Clinical Pharmacist 04/02/2018 7:56 AM

## 2018-04-03 LAB — CULTURE, BLOOD (ROUTINE X 2)
Culture: NO GROWTH
Culture: NO GROWTH
Special Requests: ADEQUATE

## 2018-04-03 LAB — FUNGAL ANTIBODIES PANEL, ID-BLOOD
ASPERGILLUS FUMIGATUS IGG: NEGATIVE
Aspergillus flavus: NEGATIVE
Aspergillus niger: NEGATIVE
Blastomyces Abs, Qn, DID: NEGATIVE
HISTOPLASMA AB ID: NEGATIVE

## 2018-04-03 LAB — CULTURE, BLOOD (SINGLE)
Culture: NO GROWTH
Special Requests: ADEQUATE

## 2018-04-05 ENCOUNTER — Telehealth: Payer: Self-pay | Admitting: Nurse Practitioner

## 2018-04-05 ENCOUNTER — Telehealth: Payer: Self-pay

## 2018-04-05 NOTE — Telephone Encounter (Signed)
Note written.  Patient should keep his hospital follow-up visit.

## 2018-04-05 NOTE — Telephone Encounter (Signed)
Pt needs a work note.  Her had pneumonia and was hospitalized.  He is not able to go back to work.  [phone]

## 2018-04-05 NOTE — Telephone Encounter (Signed)
Attempted to contact the pt, no answer. No vm.  

## 2018-04-05 NOTE — Telephone Encounter (Signed)
Incoming call

## 2018-04-05 NOTE — Telephone Encounter (Signed)
EMMI Follow-up: Mr. Benjamin Owens had received an automated call on Sunday and left me a voicemail to call him. I called him but voicemail was not setup. Mr. Benjamin Owens called again before I could enter the note and I explained the process of two automated calls post discharge to see how he was doing.  Said he got his Rx's filled, was working on getting appointment scheduled with [facility] pulmonary and was aware of his appointment on Friday.  York Spaniel was feeling some better but still hurting.  Went to church with his grandmother on Sunday and had to push her in the wheelchair so hurting more today. Coming by on Tuesday to pick up note for work and wanted to see about getting a copy of his bill.  Phone number given to customer service.  No other needs noted.

## 2018-04-05 NOTE — Telephone Encounter (Signed)
Transition Care Management Follow-up Telephone Call  Date of discharge and from where: [facility] on 04/02/18.  How have you been since you were released from the hospital? Doing better but still winded and weak. Pt is still having the chest pain but not as bad. All s/s are better than before the hospital stay. Declines dizziness, tightness, fever or n/v/d.   Any questions or concerns? Yes, pt would like to know if this can reoccur?  Items Reviewed:  Did the pt receive and understand the discharge instructions provided? Yes   Medications obtained and verified? Yes   Any new allergies since your discharge? No   Dietary orders reviewed? N/A  Do you have support at home? Yes   Other (ie: DME, Home Health, etc) N/A  Functional Questionnaire: (I = Independent and D = Dependent)  Bathing/Dressing- I   Meal Prep- I  Eating- I  Maintaining continence- I  Transferring/Ambulation- I  Managing Meds- I   Follow up appointments reviewed:    PCP Hospital f/u appt confirmed? Yes  Scheduled to see [name] on 04/09/18 @ 1:40 PM.  Specialist Hospital f/u appt confirmed? Yes   Are transportation arrangements needed? No   If their condition worsens, is the pt aware to call  their PCP or go to the ED? Yes  Was the patient provided with contact information for the PCP's office or ED? Yes  Was the pt encouraged to call back with questions or concerns? Yes

## 2018-04-05 NOTE — Progress Notes (Signed)
     Benjamin Owens was admitted to the Hospital on 03/29/2018 and Discharged  04/05/2018 and should be excused from work/school   for 10 days  starting 03/29/2018 , may return to work/school without any restrictions.    Katha Hamming M.D on 04/05/2018,at 12:18 PM

## 2018-04-06 NOTE — Telephone Encounter (Signed)
The pt was notified and work note pick up.

## 2018-04-06 NOTE — Telephone Encounter (Signed)
Attempted to contact the pt, no answer. LMOM to return my call. The pt work excuse will be left upfront for pick up.

## 2018-04-07 ENCOUNTER — Telehealth: Payer: Self-pay

## 2018-04-07 NOTE — Telephone Encounter (Signed)
EMMI Follow-up: Benjamin Owens had received the second automated call and dialed my number back.  Said everything was going well. No needs noted.

## 2018-04-09 ENCOUNTER — Ambulatory Visit (INDEPENDENT_AMBULATORY_CARE_PROVIDER_SITE_OTHER): Payer: BLUE CROSS/BLUE SHIELD | Admitting: Nurse Practitioner

## 2018-04-09 ENCOUNTER — Encounter: Payer: Self-pay | Admitting: Nurse Practitioner

## 2018-04-09 ENCOUNTER — Other Ambulatory Visit: Payer: Self-pay

## 2018-04-09 VITALS — BP 107/60 | HR 85 | Temp 98.4°F | Resp 18 | Ht 72.0 in | Wt 170.8 lb

## 2018-04-09 DIAGNOSIS — J181 Lobar pneumonia, unspecified organism: Secondary | ICD-10-CM | POA: Diagnosis not present

## 2018-04-09 DIAGNOSIS — Z09 Encounter for follow-up examination after completed treatment for conditions other than malignant neoplasm: Secondary | ICD-10-CM

## 2018-04-09 DIAGNOSIS — J189 Pneumonia, unspecified organism: Secondary | ICD-10-CM

## 2018-04-09 NOTE — Patient Instructions (Addendum)
Benjamin Owens,   Thank you for coming in to clinic today.  1. Follow-up with chest xray 1/22-23/2020.  This will make sure the infection has resolved.  This order is placed. Come to clinic anytime Mon-Friday 8-4:30 pm for this xray.  2. I recommend you call [facility] infusion to reschedule your next remicade to at least 1 week later.  Please schedule a follow-up appointment with [name], AGNP. Return if symptoms worsen or fail to improve AND for xray in about 1 week.  If you have any other questions or concerns, please feel free to call the clinic or send a message through MyChart. You may also schedule an earlier appointment if necessary.  You will receive a survey after today's visit either digitally by e-mail or paper by [employer]. Your experiences and feedback matter to [place].  Please respond so we know how we are doing as we provide care for you.  [name], DNP, AGNP-BC Adult Gerontology Nurse Practitioner [facility], [facility]

## 2018-04-09 NOTE — Progress Notes (Signed)
Subjective:    Patient ID: Benjamin Owens, male    DOB: Apr 23, 1970, 48 y.o.   MRN: [id number]  Benjamin Owens is a 48 y.o. male presenting on 04/09/2018 for Hospitalization Follow-up (diagnose Pneumonia )   HPI Hospital Follow-up, Pneumonia Patient was admitted for septic embolism and pleuritic chest pain.  Chest xray and CT scan showed infiltrates of lungs.    Since being discharged, patient has had good response to treatment.  He has continued to improve.  Only has pain with very deep breath now.  Has no significant shortness of breath.  Patient feels good and reports his strength is returning. - Patient works in [employer]).  Not regularly dusty with fibers in air at his work.  He asks when he needs to return to work.  Social History   Tobacco Use  . Smoking status: Current Every Day Smoker    Packs/day: 0.25  . Smokeless tobacco: Never Used  Substance Use Topics  . Alcohol use: Yes    Alcohol/week: 3.0 standard drinks    Types: 3 Cans of beer per week    Comment: weekdays 3 x 16 oz beers; Weekend approx 2 cases over Sat/Sun.  . Drug use: Never    Review of Systems Per HPI unless specifically indicated above     Objective:    BP 107/60 (BP Location: Right Arm, Patient Position: Sitting, Cuff Size: Normal)   Pulse 85   Temp 98.4 F (36.9 C) (Oral)   Resp 18   Ht 6' (1.829 m)   Wt 170 lb 12.8 oz (77.5 kg)   SpO2 100%   BMI 23.16 kg/m   Wt Readings from Last 3 Encounters:  04/09/18 170 lb 12.8 oz (77.5 kg)  04/02/18 166 lb 1.6 oz (75.3 kg)  03/26/18 176 lb (79.8 kg)    Physical Exam Vitals signs reviewed.  Constitutional:      General: He is not in acute distress.    Appearance: He is well-developed.  HENT:     Head: Normocephalic and atraumatic.     Right Ear: Tympanic membrane, ear canal and external ear normal.     Left Ear: Tympanic membrane, ear canal and external ear normal.     Mouth/Throat:     Mouth: Mucous membranes  are moist.     Pharynx: Oropharynx is clear.  Cardiovascular:     Rate and Rhythm: Normal rate and regular rhythm.     Pulses:          Radial pulses are 2+ on the right side and 2+ on the left side.       Posterior tibial pulses are 1+ on the right side and 1+ on the left side.     Heart sounds: Normal heart sounds, S1 normal and S2 normal.  Pulmonary:     Effort: Pulmonary effort is normal. No respiratory distress.     Breath sounds: Normal breath sounds and air entry. No stridor. No wheezing, rhonchi or rales.  Chest:     Chest wall: No tenderness.  Musculoskeletal:     Right lower leg: No edema.     Left lower leg: No edema.  Skin:    General: Skin is warm and dry.     Capillary Refill: Capillary refill takes less than 2 seconds.  Neurological:     Mental Status: He is alert and oriented to person, place, and time.  Psychiatric:        Attention and Perception:  Attention normal.        Mood and Affect: Mood and affect normal.        Behavior: Behavior normal. Behavior is cooperative.        Thought Content: Thought content normal.        Judgment: Judgment normal.    Results for orders placed or performed during the hospital encounter of 03/29/18  Blood Culture (routine x 2)  Result Value Ref Range   Specimen Description BLOOD RAC    Special Requests      BOTTLES DRAWN AEROBIC AND ANAEROBIC Blood Culture adequate volume   Culture      NO GROWTH 5 DAYS Performed at [facility], [address] [place], [place] [address]    Report Status 04/03/2018 FINAL   Blood Culture (routine x 2)  Result Value Ref Range   Specimen Description BLOOD LAC    Special Requests      BOTTLES DRAWN AEROBIC AND ANAEROBIC Blood Culture results may not be optimal due to an excessive volume of blood received in culture bottles   Culture      NO GROWTH 5 DAYS Performed at [facility], [address] [place], [place] [address]    Report Status 04/03/2018 FINAL   Blood  culture (routine single)  Result Value Ref Range   Specimen Description BLOOD L HAND    Special Requests      BOTTLES DRAWN AEROBIC AND ANAEROBIC Blood Culture adequate volume   Culture      NO GROWTH 5 DAYS Performed at [facility], [address] [place], [place] [address]    Report Status 04/03/2018 FINAL   MRSA PCR Screening  Result Value Ref Range   MRSA by PCR NEGATIVE NEGATIVE  Basic metabolic panel  Result Value Ref Range   Sodium 135 135 - 145 mmol/L   Potassium 3.5 3.5 - 5.1 mmol/L   Chloride 102 98 - 111 mmol/L   CO2 27 22 - 32 mmol/L   Glucose, Bld 85 70 - 99 mg/dL   BUN 11 6 - 20 mg/dL   Creatinine, Ser 9.140.92 0.61 - 1.24 mg/dL   Calcium 8.3 (L) 8.9 - 10.3 mg/dL   GFR calc non Af Amer >60 >60 mL/min   GFR calc Af Amer >60 >60 mL/min   Anion gap 6 5 - 15  CBC  Result Value Ref Range   WBC 11.7 (H) 4.0 - 10.5 K/uL   RBC 4.96 4.22 - 5.81 MIL/uL   Hemoglobin 13.4 13.0 - 17.0 g/dL   HCT 78.240.9 95.639.0 - 21.352.0 %   MCV 82.5 80.0 - 100.0 fL   MCH 27.0 26.0 - 34.0 pg   MCHC 32.8 30.0 - 36.0 g/dL   RDW 08.615.9 (H) 57.811.5 - 46.915.5 %   Platelets 346 150 - 400 K/uL   nRBC 0.0 0.0 - 0.2 %  Troponin I - ONCE - STAT  Result Value Ref Range   Troponin I <0.03 <0.03 ng/mL  Hepatic function panel  Result Value Ref Range   Total Protein 9.4 (H) 6.5 - 8.1 g/dL   Albumin 3.1 (L) 3.5 - 5.0 g/dL   AST 29 15 - 41 U/L   ALT 16 0 - 44 U/L   Alkaline Phosphatase 213 (H) 38 - 126 U/L   Total Bilirubin 0.8 0.3 - 1.2 mg/dL   Bilirubin, Direct 0.1 0.0 - 0.2 mg/dL   Indirect Bilirubin 0.7 0.3 - 0.9 mg/dL  Lipase, blood  Result Value Ref  Range   Lipase 38 11 - 51 U/L  Lactic acid, plasma  Result Value Ref Range   Lactic Acid, Venous 0.7 0.5 - 1.9 mmol/L  Procalcitonin  Result Value Ref Range   Procalcitonin 0.59 ng/mL  Magnesium  Result Value Ref Range   Magnesium 1.8 1.7 - 2.4 mg/dL  Phosphorus  Result Value Ref Range   Phosphorus 2.5 2.5 - 4.6 mg/dL  Calcium, ionized    Result Value Ref Range   Calcium, Ionized, Serum 4.8 4.5 - 5.6 mg/dL  Prealbumin  Result Value Ref Range   Prealbumin 9.8 (L) 18 - 38 mg/dL  APTT  Result Value Ref Range   aPTT 42 (H) 24 - 36 seconds  Protime-INR  Result Value Ref Range   Prothrombin Time 14.6 11.4 - 15.2 seconds   INR 1.15   Basic metabolic panel  Result Value Ref Range   Sodium 133 (L) 135 - 145 mmol/L   Potassium 2.8 (L) 3.5 - 5.1 mmol/L   Chloride 103 98 - 111 mmol/L   CO2 22 22 - 32 mmol/L   Glucose, Bld 133 (H) 70 - 99 mg/dL   BUN 9 6 - 20 mg/dL   Creatinine, Ser 1.61 0.61 - 1.24 mg/dL   Calcium 7.9 (L) 8.9 - 10.3 mg/dL   GFR calc non Af Amer >60 >60 mL/min   GFR calc Af Amer >60 >60 mL/min   Anion gap 8 5 - 15  CBC  Result Value Ref Range   WBC 21.0 (H) 4.0 - 10.5 K/uL   RBC 4.58 4.22 - 5.81 MIL/uL   Hemoglobin 12.5 (L) 13.0 - 17.0 g/dL   HCT 09.6 (L) 04.5 - 40.9 %   MCV 81.7 80.0 - 100.0 fL   MCH 27.3 26.0 - 34.0 pg   MCHC 33.4 30.0 - 36.0 g/dL   RDW 81.1 (H) 91.4 - 78.2 %   Platelets 282 150 - 400 K/uL   nRBC 0.0 0.0 - 0.2 %  Vancomycin, trough  Result Value Ref Range   Vancomycin Tr 16 15 - 20 ug/mL  Magnesium  Result Value Ref Range   Magnesium 1.4 (L) 1.7 - 2.4 mg/dL  Basic metabolic panel  Result Value Ref Range   Sodium 132 (L) 135 - 145 mmol/L   Potassium 3.9 3.5 - 5.1 mmol/L   Chloride 104 98 - 111 mmol/L   CO2 21 (L) 22 - 32 mmol/L   Glucose, Bld 99 70 - 99 mg/dL   BUN 12 6 - 20 mg/dL   Creatinine, Ser 9.56 0.61 - 1.24 mg/dL   Calcium 7.9 (L) 8.9 - 10.3 mg/dL   GFR calc non Af Amer >60 >60 mL/min   GFR calc Af Amer >60 >60 mL/min   Anion gap 7 5 - 15  Magnesium  Result Value Ref Range   Magnesium 2.0 1.7 - 2.4 mg/dL  CBC  Result Value Ref Range   WBC 25.3 (H) 4.0 - 10.5 K/uL   RBC 4.38 4.22 - 5.81 MIL/uL   Hemoglobin 11.8 (L) 13.0 - 17.0 g/dL   HCT 21.3 (L) 08.6 - 57.8 %   MCV 81.7 80.0 - 100.0 fL   MCH 26.9 26.0 - 34.0 pg   MCHC 33.0 30.0 - 36.0 g/dL   RDW 46.9  (H) 62.9 - 15.5 %   Platelets 263 150 - 400 K/uL   nRBC 0.0 0.0 - 0.2 %  Influenza panel by PCR (type A & B)  Result Value Ref Range  Influenza A By PCR NEGATIVE NEGATIVE   Influenza B By PCR NEGATIVE NEGATIVE  Vancomycin, trough  Result Value Ref Range   Vancomycin Tr 15 15 - 20 ug/mL  Fungitell, Serum  Result Value Ref Range   Fungitell Result <31 <80 pg/mL  Fungal antibodies panel, id-blood  Result Value Ref Range   Aspergillus fumigatus, IgG Negative Neg:<1:1   Aspergillus flavus Negative Neg:<1:1   Aspergillus [place] Negative Neg:<1:1   Blastomyces Abs, Qn, DID Negative Neg:<1:1   Histoplasma Ab, Immunodiffusion Negative Neg:<1:1  Legionella Pneumophila Serogp 1 Ur Ag  Result Value Ref Range   L. pneumophila Serogp 1 Ur Ag Negative Negative   Source of Sample URINE, RANDOM   CBC  Result Value Ref Range   WBC 15.4 (H) 4.0 - 10.5 K/uL   RBC 4.42 4.22 - 5.81 MIL/uL   Hemoglobin 12.1 (L) 13.0 - 17.0 g/dL   HCT 62.9 (L) 47.6 - 54.6 %   MCV 82.4 80.0 - 100.0 fL   MCH 27.4 26.0 - 34.0 pg   MCHC 33.2 30.0 - 36.0 g/dL   RDW 50.3 54.6 - 56.8 %   Platelets 279 150 - 400 K/uL   nRBC 0.0 0.0 - 0.2 %  Basic metabolic panel  Result Value Ref Range   Sodium 131 (L) 135 - 145 mmol/L   Potassium 3.5 3.5 - 5.1 mmol/L   Chloride 103 98 - 111 mmol/L   CO2 22 22 - 32 mmol/L   Glucose, Bld 109 (H) 70 - 99 mg/dL   BUN 10 6 - 20 mg/dL   Creatinine, Ser 1.27 0.61 - 1.24 mg/dL   Calcium 8.0 (L) 8.9 - 10.3 mg/dL   GFR calc non Af Amer >60 >60 mL/min   GFR calc Af Amer >60 >60 mL/min   Anion gap 6 5 - 15  Cryptococcus Antigen, Serum  Result Value Ref Range   Cryptococcus Antigen, Serum Negative Negative  HIV Antibody (routine testing w rflx)  Result Value Ref Range   HIV Screen 4th Generation wRfx Non Reactive Non Reactive  Toxoplasma antibodies- IgG and  IgM  Result Value Ref Range   Toxoplasma IgG Ratio <3.0 0.0 - 7.1 IU/mL   Toxoplasma Antibody- IgM <3.0 0.0 - 7.9 AU/mL    Comment Comment   Creatinine, serum  Result Value Ref Range   Creatinine, Ser 0.95 0.61 - 1.24 mg/dL   GFR calc non Af Amer >60 >60 mL/min   GFR calc Af Amer >60 >60 mL/min  Potassium  Result Value Ref Range   Potassium 3.7 3.5 - 5.1 mmol/L  CBC  Result Value Ref Range   WBC 11.1 (H) 4.0 - 10.5 K/uL   RBC 4.72 4.22 - 5.81 MIL/uL   Hemoglobin 12.8 (L) 13.0 - 17.0 g/dL   HCT 51.7 (L) 00.1 - 74.9 %   MCV 80.9 80.0 - 100.0 fL   MCH 27.1 26.0 - 34.0 pg   MCHC 33.5 30.0 - 36.0 g/dL   RDW 44.9 67.5 - 91.6 %   Platelets 289 150 - 400 K/uL   nRBC 0.0 0.0 - 0.2 %  ECHOCARDIOGRAM COMPLETE  Result Value Ref Range   BP 110/72 mmHg      Assessment & Plan:   Problem List Items Addressed This Visit    None    Visit Diagnoses    Hospital discharge follow-up    -  Primary   Pneumonia of both lower lobes due to infectious organism (HCC)  Relevant Orders   DG Chest 2 View    Stable and improving. Suspect possible septic embolism from hidradenitis suppurativa. Patient remains on prophylactic cephalosporin.  Plan: 1. Follow-up chest xray in about 1 week to ensure full resolution of infiltrates. 2. Consider postponing next Remicade infusion 1-2 weeks until fully healed.  [facility] with [facility] prior to infusion for additional guidelines about delaying your treatment. 3. Follow-up prn if worsening chest pain or not resolving in about 1 week.  May return to work now.  Follow up plan: Return if symptoms worsen or fail to improve AND for xray in about 1 week.  Wilhelmina McardleLauren Keena Heesch, DNP, AGPCNP-BC Adult Gerontology Primary Care Nurse Practitioner [facility] 04/09/2018, 2:12 PM

## 2018-04-11 ENCOUNTER — Encounter: Payer: Self-pay | Admitting: Nurse Practitioner

## 2018-04-15 ENCOUNTER — Ambulatory Visit
Admission: RE | Admit: 2018-04-15 | Discharge: 2018-04-15 | Disposition: A | Discharge status: Home / Self Care | Payer: BLUE CROSS/BLUE SHIELD | Source: Ambulatory Visit | Attending: Nurse Practitioner | Admitting: Nurse Practitioner

## 2018-04-15 DIAGNOSIS — J181 Lobar pneumonia, unspecified organism: Secondary | ICD-10-CM | POA: Diagnosis not present

## 2018-04-15 DIAGNOSIS — J189 Pneumonia, unspecified organism: Secondary | ICD-10-CM

## 2018-04-16 MED ORDER — LEVOFLOXACIN 750 MG PO TABS: 750.0000 mg | ORAL_TABLET | Freq: Every day | ORAL | 0 refills | Status: DC

## 2018-04-16 NOTE — Addendum Note (Signed)
Addended by: Wilhelmina McardleKENNEDY, Larissa Pegg R on: 04/16/2018 01:05 PM   Modules accepted: Orders

## 2018-04-20 ENCOUNTER — Emergency Department
Admission: EM | Admit: 2018-04-20 | Discharge: 2018-04-20 | Disposition: A | Discharge status: Home / Self Care | Payer: BLUE CROSS/BLUE SHIELD | Attending: Emergency Medicine | Admitting: Emergency Medicine

## 2018-04-20 ENCOUNTER — Other Ambulatory Visit: Payer: Self-pay

## 2018-04-20 ENCOUNTER — Encounter: Payer: Self-pay | Admitting: Intensive Care

## 2018-04-20 DIAGNOSIS — M2550 Pain in unspecified joint: Secondary | ICD-10-CM | POA: Insufficient documentation

## 2018-04-20 DIAGNOSIS — E876 Hypokalemia: Secondary | ICD-10-CM | POA: Insufficient documentation

## 2018-04-20 DIAGNOSIS — Z79899 Other long term (current) drug therapy: Secondary | ICD-10-CM | POA: Insufficient documentation

## 2018-04-20 DIAGNOSIS — F17210 Nicotine dependence, cigarettes, uncomplicated: Secondary | ICD-10-CM | POA: Diagnosis not present

## 2018-04-20 LAB — CBC WITH DIFFERENTIAL/PLATELET
Abs Immature Granulocytes: 0.06 10*3/uL (ref 0.00–0.07)
Basophils Absolute: 0.1 10*3/uL (ref 0.0–0.1)
Basophils Relative: 0 %
Eosinophils Absolute: 0.3 10*3/uL (ref 0.0–0.5)
Eosinophils Relative: 2 %
HCT: 35 % — ABNORMAL LOW (ref 39.0–52.0)
Hemoglobin: 11.7 g/dL — ABNORMAL LOW (ref 13.0–17.0)
Immature Granulocytes: 0 %
Lymphocytes Relative: 16 %
Lymphs Abs: 2.5 10*3/uL (ref 0.7–4.0)
MCH: 26.7 pg (ref 26.0–34.0)
MCHC: 33.4 g/dL (ref 30.0–36.0)
MCV: 79.7 fL — ABNORMAL LOW (ref 80.0–100.0)
Monocytes Absolute: 1.6 10*3/uL — ABNORMAL HIGH (ref 0.1–1.0)
Monocytes Relative: 10 %
NEUTROS PCT: 72 %
Neutro Abs: 11.4 10*3/uL — ABNORMAL HIGH (ref 1.7–7.7)
Platelets: 409 10*3/uL — ABNORMAL HIGH (ref 150–400)
RBC: 4.39 MIL/uL (ref 4.22–5.81)
RDW: 15.9 % — ABNORMAL HIGH (ref 11.5–15.5)
WBC: 15.9 10*3/uL — ABNORMAL HIGH (ref 4.0–10.5)
nRBC: 0 % (ref 0.0–0.2)

## 2018-04-20 LAB — BASIC METABOLIC PANEL
Anion gap: 7 (ref 5–15)
BUN: 10 mg/dL (ref 6–20)
CO2: 25 mmol/L (ref 22–32)
CREATININE: 0.97 mg/dL (ref 0.61–1.24)
Calcium: 8 mg/dL — ABNORMAL LOW (ref 8.9–10.3)
Chloride: 101 mmol/L (ref 98–111)
GFR calc Af Amer: >60 mL/min (ref >60)
Glucose, Bld: 109 mg/dL — ABNORMAL HIGH (ref 70–99)
Potassium: 3.1 mmol/L — ABNORMAL LOW (ref 3.5–5.1)
Sodium: 133 mmol/L — ABNORMAL LOW (ref 135–145)

## 2018-04-20 LAB — INFLUENZA PANEL BY PCR (TYPE A & B)
Influenza A By PCR: NEGATIVE
Influenza B By PCR: NEGATIVE

## 2018-04-20 LAB — CK: Total CK: 44 U/L — ABNORMAL LOW (ref 49–397)

## 2018-04-20 MED ORDER — CALCIUM CARBONATE ANTACID 500 MG PO CHEW
400.0000 mg | CHEWABLE_TABLET | Freq: Once | ORAL | Status: AC
  Administered 2018-04-20: 400 mg via ORAL
  Filled 2018-04-20: qty 2

## 2018-04-20 MED ORDER — POTASSIUM CHLORIDE CRYS ER 20 MEQ PO TBCR
20.0000 meq | EXTENDED_RELEASE_TABLET | Freq: Once | ORAL | Status: AC
  Administered 2018-04-20: 20 meq via ORAL
  Filled 2018-04-20: qty 1

## 2018-04-20 MED ORDER — KETOROLAC TROMETHAMINE 30 MG/ML IJ SOLN
30.0000 mg | Freq: Once | INTRAMUSCULAR | Status: AC
  Administered 2018-04-20: 30 mg via INTRAVENOUS
  Filled 2018-04-20: qty 1

## 2018-04-20 MED ORDER — POTASSIUM CHLORIDE ER 10 MEQ PO TBCR: 10.0000 meq | EXTENDED_RELEASE_TABLET | Freq: Two times a day (BID) | ORAL | 0 refills | Status: DC

## 2018-04-20 MED ORDER — CALCIUM CARBONATE 600 MG PO TABS: 600.0000 mg | ORAL_TABLET | Freq: Two times a day (BID) | ORAL | 0 refills | Status: DC

## 2018-04-20 MED ORDER — TRAMADOL HCL 50 MG PO TABS: 50.0000 mg | ORAL_TABLET | Freq: Four times a day (QID) | ORAL | 0 refills | Status: AC | PRN

## 2018-04-20 MED ORDER — TRAMADOL HCL 50 MG PO TABS
50.0000 mg | ORAL_TABLET | Freq: Once | ORAL | Status: AC
  Administered 2018-04-20: 50 mg via ORAL
  Filled 2018-04-20: qty 1

## 2018-04-20 MED ORDER — SODIUM CHLORIDE 0.9 % IV BOLUS
1000.0000 mL | Freq: Once | INTRAVENOUS | Status: AC
  Administered 2018-04-20: 1000 mL via INTRAVENOUS

## 2018-04-20 NOTE — Discharge Instructions (Addendum)
Take the potassium and calcium supplements as prescribed for the next week.  Take the antibiotic that your regular doctor prescribed and finish the full course.  Return to the ER for new or worsening pain, weakness or numbness, difficulty walking, fever, chest pain, lightheadedness, or any other new or worsening symptoms that concern you.

## 2018-04-20 NOTE — ED Notes (Signed)
AAOx3.  Skin warm and dry.  NAD 

## 2018-04-20 NOTE — ED Provider Notes (Signed)
[facility] Emergency Department Provider Note ____________________________________________   First MD Initiated Contact with Patient 04/20/18 1014     (approximate)  I have reviewed the triage vital signs and the nursing notes.   HISTORY  Chief Complaint No chief complaint on file.    HPI Benjamin Owens is a 48 y.o. male with PMH as noted below as well as a recent admission for pneumonia who presents with arthralgias over the last 2 days, diffuse and bilateral, but not associated with other acute symptoms.  The patient denies fever chills, vomiting or diarrhea, shortness of breath or cough, or upper respiratory symptoms.  He states that he was on antibiotics for several days after he left the hospital, and had a follow-up x-ray.  He was told by his doctor that he needs to be restarted on an antibiotic for the pneumonia for a little bit longer and states he is going to pick this prescription up today.   Past Medical History:  Diagnosis Date  . Hay fever   . Hidradenitis suppurativa     Patient Active Problem List   Diagnosis Date Noted  . Chest pain 03/29/2018  . Nicotine dependence, uncomplicated 11/05/2015  . Hidradenitis suppurativa 01/17/2013    Past Surgical History:  Procedure Laterality Date  . NO PAST SURGERIES      Prior to Admission medications   Medication Sig Start Date End Date Taking? Authorizing Provider  clindamycin (CLEOCIN) 300 MG capsule Take 300 mg by mouth 2 (two) times daily.  11/25/17  Yes [provider]  gabapentin (NEURONTIN) 300 MG capsule Take 300 mg by mouth as needed.  10/07/17  Yes [provider]  inFLIXimab (REMICADE) 100 MG injection Inject into the vein.   Yes [provider]  varenicline (CHANTIX STARTING MONTH PAK) 0.5 MG X 11 & 1 MG X 42 tablet Take one 0.5 mg tab by mouth once daily for 3 days, then take one 0.5 mg tab twice daily for 4 days, then take one 1 mg tab twice daily.  03/26/18 04/26/18 Yes Galen [place], NP  calcium carbonate (OS-CAL) 600 MG TABS tablet Take 1 tablet (600 mg total) by mouth 2 (two) times daily with a meal for 7 days. 04/20/18 04/27/18  Dionne Bucy, MD  potassium chloride (K-DUR) 10 MEQ tablet Take 1 tablet (10 mEq total) by mouth 2 (two) times daily for 7 days. 04/20/18 04/27/18  Dionne Bucy, MD  traMADol (ULTRAM) 50 MG tablet Take 1 tablet (50 mg total) by mouth every 6 (six) hours as needed for up to 5 days (pain). 04/20/18 04/25/18  Dionne Bucy, MD    Allergies Vancomycin and Azithromycin  Family History  Problem Relation Age of Onset  . Stroke Mother   . Heart disease Mother        Degenerative heart disease  . Diabetes Mother   . Throat cancer Father   . Lung cancer Maternal Aunt   . Breast cancer Paternal Aunt   . Lung cancer Paternal Uncle        smoker  . Healthy Maternal Grandmother   . Heart attack Maternal Grandfather   . Breast cancer Paternal Grandmother   . Heart attack Paternal Grandfather   . Healthy Cousin   . Prostate cancer Paternal Uncle   . Breast cancer Maternal Aunt     Social History Social History   Tobacco Use  . Smoking status: Current Every Day Smoker    Packs/day: 0.25  Types: Cigarettes  . Smokeless tobacco: Never Used  Substance Use Topics  . Alcohol use: Yes    Alcohol/week: 3.0 standard drinks    Types: 3 Cans of beer per week    Comment: weekdays 3 x 16 oz beers; Weekend approx 2 cases over Sat/Sun.  . Drug use: Yes    Types: Marijuana    Review of Systems  Constitutional: No fever. Eyes: No visual changes. ENT: No sore throat or congestion. Cardiovascular: Denies chest pain. Respiratory: Denies shortness of breath. Gastrointestinal: No vomiting or diarrhea.  Genitourinary: Negative for dysuria.  Musculoskeletal: Negative for back pain.  Positive for arthralgias. Skin: Negative for rash. Neurological: Negative for headaches, focal weakness or  numbness.   ____________________________________________   PHYSICAL EXAM:  VITAL SIGNS: ED Triage Vitals  Enc Vitals Group     BP 04/20/18 0907 (!) 117/96     Pulse Rate 04/20/18 0907 88     Resp 04/20/18 0907 15     Temp 04/20/18 0907 98.4 F (36.9 C)     Temp Source 04/20/18 0907 Oral     SpO2 04/20/18 0907 100 %     Weight 04/20/18 0908 170 lb (77.1 kg)     Height 04/20/18 0908 6' (1.829 m)     Head Circumference --      Peak Flow --      Pain Score 04/20/18 0917 10     Pain Loc --      Pain Edu? --      Excl. in GC? --     Constitutional: Alert and oriented. Well appearing and in no acute distress. Eyes: Conjunctivae are normal.  Head: Atraumatic. Nose: No congestion/rhinnorhea. Mouth/Throat: Mucous membranes are slightly dry.   Neck: Normal range of motion.  Cardiovascular: Normal rate, regular rhythm. Grossly normal heart sounds.  Good peripheral circulation. Respiratory: Normal respiratory effort.  No retractions. Lungs CTAB. Gastrointestinal: No distention.  Musculoskeletal: No lower extremity edema.  Extremities warm and well perfused.  Neurologic:  Normal speech and language. No gross focal neurologic deficits are appreciated.  Skin:  Skin is warm and dry. No rash noted. Psychiatric: Mood and affect are normal. Speech and behavior are normal.  ____________________________________________   LABS (all labs ordered are listed, but only abnormal results are displayed)  Labs Reviewed  CK - Abnormal; Notable for the following components:      Result Value   Total CK 44 (*)    All other components within normal limits  CBC WITH DIFFERENTIAL/PLATELET - Abnormal; Notable for the following components:   WBC 15.9 (*)    Hemoglobin 11.7 (*)    HCT 35.0 (*)    MCV 79.7 (*)    RDW 15.9 (*)    Platelets 409 (*)    Neutro Abs 11.4 (*)    Monocytes Absolute 1.6 (*)    All other components within normal limits  BASIC METABOLIC PANEL - Abnormal; Notable for the  following components:   Sodium 133 (*)    Potassium 3.1 (*)    Glucose, Bld 109 (*)    Calcium 8.0 (*)    All other components within normal limits  INFLUENZA PANEL BY PCR (TYPE A & B)   ____________________________________________  EKG   ____________________________________________  RADIOLOGY    ____________________________________________   PROCEDURES  Procedure(s) performed: No  Procedures  Critical Care performed: No ____________________________________________   INITIAL IMPRESSION / ASSESSMENT AND PLAN / ED COURSE  Pertinent labs & imaging results that were available during my  care of the patient were reviewed by me and considered in my medical decision making (see chart for details).  48 year old male with PMH as noted above presents with arthralgias over the last few days without a lot of significant associated symptoms.  I reviewed the past medical records in Epic.  Patient was admitted earlier this month initially with chest pain and findings on CT concerning for septic emboli, but according to the hospital course, the patient was found to have pneumonia.  On exam the patient is well-appearing and his vital signs are normal.  The remainder of the exam is unremarkable.  He has full range of motion at all joints.  Initial labs obtained from triage reveal some mild hypocalcemia and hypokalemia which could be contributing the patient's symptoms.  Overall I suspect most likely viral syndrome or dehydration.  There is no evidence of sepsis, septic arthritis, endocarditis, or other more concerning infectious etiology.  We will give fluids, NSAID, and obtain flu swab.  If the flu is negative I anticipate discharge home.  ----------------------------------------- 2:21 PM on 04/20/2018 -----------------------------------------  Flu swab is negative.  The patient had no significant relief after Toradol so I gave a dose of tramadol as well.  Although the hypokalemia  and hypocalcemia are not particular severe and are likely at least subacute, I think that these are certainly likely to be contributing to the patient's symptoms so I will give him a course of supplementation of both.  The patient is stable for discharge home at this time.  I counseled him on the results of the work-up.  I will give a week supply of potassium chloride and calcium carbonate.  I also prescribed a small quantity of tramadol for home.  Return precautions given, and the patient expressed understanding. ____________________________________________   FINAL CLINICAL IMPRESSION(S) / ED DIAGNOSES  Final diagnoses:  Arthralgia, unspecified joint  Hypocalcemia  Hypokalemia      NEW MEDICATIONS STARTED DURING THIS VISIT:  New Prescriptions   CALCIUM CARBONATE (OS-CAL) 600 MG TABS TABLET    Take 1 tablet (600 mg total) by mouth 2 (two) times daily with a meal for 7 days.   POTASSIUM CHLORIDE (K-DUR) 10 MEQ TABLET    Take 1 tablet (10 mEq total) by mouth 2 (two) times daily for 7 days.   TRAMADOL (ULTRAM) 50 MG TABLET    Take 1 tablet (50 mg total) by mouth every 6 (six) hours as needed for up to 5 days (pain).     Note:  This document was prepared using Dragon voice recognition software and may include unintentional dictation errors.    Dionne BucySiadecki, Laryah Neuser, MD 04/20/18 1424

## 2018-04-20 NOTE — ED Triage Notes (Signed)
Patient c/o pain in every single joint in his body X2 days. Patient admitted to hospital about two weeks ago for pneumonia. Denies fevers

## 2018-04-21 ENCOUNTER — Telehealth: Payer: Self-pay | Admitting: Nurse Practitioner

## 2018-04-21 ENCOUNTER — Encounter: Payer: Self-pay | Admitting: Nurse Practitioner

## 2018-04-21 NOTE — Telephone Encounter (Signed)
Patient will need to start requesting work notes from providers who are seeing him on the day of his encounters.  Last work note without appointment.  Patient should return to work tomorrow.

## 2018-04-21 NOTE — Telephone Encounter (Signed)
Pt called said that he went to the ER on yesterday did not go to work today due to still having pain. Said that he need a note for work .

## 2018-04-26 ENCOUNTER — Other Ambulatory Visit: Payer: Self-pay

## 2018-04-26 ENCOUNTER — Ambulatory Visit (INDEPENDENT_AMBULATORY_CARE_PROVIDER_SITE_OTHER): Payer: BLUE CROSS/BLUE SHIELD | Admitting: Nurse Practitioner

## 2018-04-26 VITALS — BP 93/72 | HR 89 | Temp 98.0°F | Ht 72.0 in | Wt 164.6 lb

## 2018-04-26 DIAGNOSIS — M2550 Pain in unspecified joint: Secondary | ICD-10-CM

## 2018-04-26 DIAGNOSIS — E876 Hypokalemia: Secondary | ICD-10-CM | POA: Diagnosis not present

## 2018-04-26 DIAGNOSIS — R29898 Other symptoms and signs involving the musculoskeletal system: Secondary | ICD-10-CM

## 2018-04-26 MED ORDER — PREDNISONE 10 MG PO TABS: ORAL_TABLET | ORAL | 0 refills | Status: DC

## 2018-04-26 NOTE — Progress Notes (Signed)
Subjective:    Patient ID: Benjamin Owens, male    DOB: 14-Aug-1970, 48 y.o.   MRN: [id number]  Benjamin Owens is a 48 y.o. male presenting on 04/26/2018 for Joint Pain (persistent joint pain since 1 week ago. pt was seen at the ER on 1/28 diagnosed with mild hypocalcemia and hypokalemia )   HPIr Joint pains - Pain in ankles, knees, shoulders, neck, hands, wrists.  Cannot lift arms overhead.  - Patient took tramadol from ER and states this was "somewhat" helpful. - Patient has frequent bending, squatting, raising arms overhead with work and has been unable to complete his required work tasks. - Patient has not been at work in last week.  Patient feels like his breathing is near normal.  Rarely has sharp pain on posterior ribs.   -   Social History   Tobacco Use  . Smoking status: Current Every Day Smoker    Packs/day: 0.25    Types: Cigarettes  . Smokeless tobacco: Never Used  Substance Use Topics  . Alcohol use: Yes    Alcohol/week: 3.0 standard drinks    Types: 3 Cans of beer per week    Comment: weekdays 3 x 16 oz beers; Weekend approx 2 cases over Sat/Sun.  . Drug use: Yes    Types: Marijuana    Review of Systems Per HPI unless specifically indicated above     Objective:    BP 93/72 (BP Location: Right Arm, Patient Position: Sitting, Cuff Size: Normal)   Pulse 89   Temp 98 F (36.7 C) (Oral)   Ht 6' (1.829 m)   Wt 164 lb 9.6 oz (74.7 kg)   BMI 22.32 kg/m   Wt Readings from Last 3 Encounters:  04/26/18 164 lb 9.6 oz (74.7 kg)  04/20/18 170 lb (77.1 kg)  04/09/18 170 lb 12.8 oz (77.5 kg)    Physical Exam Vitals signs reviewed.  Constitutional:      General: He is not in acute distress.    Appearance: Normal appearance. He is well-developed and normal weight. He is not ill-appearing or diaphoretic.  HENT:     Head: Normocephalic and atraumatic.  Cardiovascular:     Rate and Rhythm: Normal rate and regular rhythm.     Pulses:          Radial pulses  are 2+ on the right side and 2+ on the left side.       Posterior tibial pulses are 1+ on the right side and 1+ on the left side.     Heart sounds: Normal heart sounds, S1 normal and S2 normal.  Pulmonary:     Effort: Pulmonary effort is normal. No respiratory distress.     Breath sounds: Normal breath sounds and air entry.  Musculoskeletal:     Right shoulder: Normal.     Left shoulder: Normal.     Right knee: Normal.     Left knee: Normal.     Right ankle: Normal.     Left ankle: Normal.     Cervical back: Normal.     Right lower leg: No edema.     Left lower leg: No edema.     Comments: Patient with aching/soreness on ROM of ankle bilaterally R>L and in bilateral shoulders.  No limitations of or changes in joint function neck, shoulders, knees, ankles.  Skin:    General: Skin is warm and dry.     Capillary Refill: Capillary refill takes less than 2 seconds.  Neurological:  Mental Status: He is alert and oriented to person, place, and time.  Psychiatric:        Attention and Perception: Attention normal.        Mood and Affect: Mood and affect normal.        Behavior: Behavior normal. Behavior is cooperative.        Thought Content: Thought content normal.        Judgment: Judgment normal.    Results for orders placed or performed during the hospital encounter of 04/20/18  CK  Result Value Ref Range   Total CK 44 (L) 49 - 397 U/L  CBC with Differential  Result Value Ref Range   WBC 15.9 (H) 4.0 - 10.5 K/uL   RBC 4.39 4.22 - 5.81 MIL/uL   Hemoglobin 11.7 (L) 13.0 - 17.0 g/dL   HCT 35.0 (L) 39.0 - 52.0 %   MCV 79.7 (L) 80.0 - 100.0 fL   MCH 26.7 26.0 - 34.0 pg   MCHC 33.4 30.0 - 36.0 g/dL   RDW 15.9 (H) 11.5 - 15.5 %   Platelets 409 (H) 150 - 400 K/uL   nRBC 0.0 0.0 - 0.2 %   Neutrophils Relative % 72 %   Neutro Abs 11.4 (H) 1.7 - 7.7 K/uL   Lymphocytes Relative 16 %   Lymphs Abs 2.5 0.7 - 4.0 K/uL   Monocytes Relative 10 %   Monocytes Absolute 1.6 (H) 0.1 - 1.0  K/uL   Eosinophils Relative 2 %   Eosinophils Absolute 0.3 0.0 - 0.5 K/uL   Basophils Relative 0 %   Basophils Absolute 0.1 0.0 - 0.1 K/uL   Immature Granulocytes 0 %   Abs Immature Granulocytes 0.06 0.00 - 0.07 K/uL  Basic metabolic panel  Result Value Ref Range   Sodium 133 (L) 135 - 145 mmol/L   Potassium 3.1 (L) 3.5 - 5.1 mmol/L   Chloride 101 98 - 111 mmol/L   CO2 25 22 - 32 mmol/L   Glucose, Bld 109 (H) 70 - 99 mg/dL   BUN 10 6 - 20 mg/dL   Creatinine, Ser 0.97 0.61 - 1.24 mg/dL   Calcium 8.0 (L) 8.9 - 10.3 mg/dL   GFR calc non Af Amer >60 >60 mL/min   GFR calc Af Amer >60 >60 mL/min   Anion gap 7 5 - 15  Influenza panel by PCR (type A & B)  Result Value Ref Range   Influenza A By PCR NEGATIVE NEGATIVE   Influenza B By PCR NEGATIVE NEGATIVE      Assessment & Plan:   Problem List Items Addressed This Visit    None    Visit Diagnoses    Polyarthralgia    -  Primary   Relevant Medications   predniSONE (DELTASONE) 10 MG tablet   Other Relevant Orders   Calcium, ionized (Completed)   Magnesium (Completed)   COMPLETE METABOLIC PANEL WITH GFR (Completed)   Parathyroid hormone, intact (no Ca) (Completed)   VITAMIN D 25 Hydroxy (Vit-D Deficiency, Fractures) (Completed)   C-reactive protein (Completed)   Sed Rate (ESR) (Completed)   Muscular deconditioning       Hypokalemia       Relevant Orders   Magnesium (Completed)   COMPLETE METABOLIC PANEL WITH GFR (Completed)   Hypocalcemia       Relevant Orders   Calcium, ionized (Completed)   Magnesium (Completed)   Parathyroid hormone, intact (no Ca) (Completed)   VITAMIN D 25 Hydroxy (Vit-D Deficiency, Fractures) (Completed)    #  Polyarthralgia Acute polyarthralgia following hospitalization for pneumonia.  In patient with hidradenitis suppurativa (HS), may be inflammatory arthritis like spondylarthritis.  Likely related to deconditioning given illness.    Plan: 1. Encourage patient to continue regular activity,  increasing intensity for return to work. 2. START prednisone 7-day taper for reducing inflammation likely causing new polyarthralgias. 3. Labs today for CRP/ESR, although are non-specific markers that may be elevated in patient with HS. 4. Follow-up prn  # Hypocalcemia Chronic hypocalcemia.  Not a new finding.  Continue with ionized calcium, PTH, Alk phos, vit D labs.  Also check mag for correction. Follow-up after labs for additional information.  Meds ordered this encounter  Medications  . predniSONE (DELTASONE) 10 MG tablet    Sig: Day 1-2 take 6 pills. Day 3 take 5 pills then reduce by 1 pill each day.    Dispense:  27 tablet    Refill:  0    Order Specific Question:   Supervising Provider    Answer:   Olin Hauser [[id number]]    Follow up plan: Return if symptoms worsen or fail to improve.  Cassell Smiles, DNP, AGPCNP-BC Adult Gerontology Primary Care Nurse Practitioner [facility] 04/26/2018, 1:29 PM

## 2018-04-26 NOTE — Patient Instructions (Addendum)
Benjamin Owens,   Thank you for coming in to clinic today.  1. Continue regular activity to regain strength.  You will need to work toward return to work as outlined in [employer].  2. You will be due for Non- FASTING BLOOD WORK.  This means you can have breakfast. - Please go ahead and schedule a "Lab Only" visit in the morning at the clinic for lab draw in the next 7 days. - Your results will be available about 2-3 days after blood draw.  If you have set up a MyChart account, you can can log in to MyChart online to view your results and a brief explanation. Also, we can discuss your results together at your next office visit if you would like.  3. Take prednisone taper 10 mg tablets Day 1 (Today): Take 6 pills at one time Day 2: Take 6 pills  Day 3: Take 5 pills Day 4: Take 4 pills Day 5: Take 3 pills Day 6: Take 2 pills Day 7: Take 1 pill then stop.  When on prednisone, no ibuprofen/Motrin/Advil or naproxen/Aleve.  YOU can take acetaminophen/Tylenol if needed.  Please schedule a follow-up appointment with [name], AGNP. Return if symptoms worsen or fail to improve.  If you have any other questions or concerns, please feel free to call the clinic or send a message through MyChart. You may also schedule an earlier appointment if necessary.  You will receive a survey after today's visit either digitally by e-mail or paper by [employer]. Your experiences and feedback matter to [place].  Please respond so we know how we are doing as we provide care for you.  [name], DNP, AGNP-BC Adult Gerontology Nurse Practitioner [facility], [facility]

## 2018-04-27 DIAGNOSIS — E876 Hypokalemia: Secondary | ICD-10-CM | POA: Diagnosis not present

## 2018-04-27 DIAGNOSIS — M2550 Pain in unspecified joint: Secondary | ICD-10-CM | POA: Diagnosis not present

## 2018-04-28 ENCOUNTER — Encounter: Payer: Self-pay | Admitting: Nurse Practitioner

## 2018-04-28 LAB — COMPLETE METABOLIC PANEL WITH GFR
AG Ratio: 0.5 (calc) — ABNORMAL LOW (ref 1.0–2.5)
ALT: 12 U/L (ref 9–46)
AST: 16 U/L (ref 10–40)
Albumin: 3 g/dL — ABNORMAL LOW (ref 3.6–5.1)
Alkaline phosphatase (APISO): 237 U/L — ABNORMAL HIGH (ref 36–130)
BUN: 9 mg/dL (ref 7–25)
CO2: 25 mmol/L (ref 20–32)
Calcium: 8.7 mg/dL (ref 8.6–10.3)
Chloride: 95 mmol/L — ABNORMAL LOW (ref 98–110)
Creat: 0.92 mg/dL (ref 0.60–1.35)
GFR, Est African American: 114 mL/min/{1.73_m2} (ref >=60)
GFR, Est Non African American: 98 mL/min/{1.73_m2} (ref >=60)
Globulin: 5.8 g/dL (calc) — ABNORMAL HIGH (ref 1.9–3.7)
Glucose, Bld: 137 mg/dL (ref 65–139)
Potassium: 4 mmol/L (ref 3.5–5.3)
Sodium: 131 mmol/L — ABNORMAL LOW (ref 135–146)
Total Bilirubin: 0.5 mg/dL (ref 0.2–1.2)
Total Protein: 8.8 g/dL — ABNORMAL HIGH (ref 6.1–8.1)

## 2018-04-28 LAB — PARATHYROID HORMONE, INTACT (NO CA): PTH: 27 pg/mL (ref 14–64)

## 2018-04-28 LAB — MAGNESIUM: Magnesium: 1.7 mg/dL (ref 1.5–2.5)

## 2018-04-28 LAB — VITAMIN D 25 HYDROXY (VIT D DEFICIENCY, FRACTURES): Vit D, 25-Hydroxy: 13 ng/mL — ABNORMAL LOW (ref 30–100)

## 2018-04-28 LAB — C-REACTIVE PROTEIN: CRP: 101.8 mg/L — ABNORMAL HIGH (ref <8.0)

## 2018-04-28 LAB — SEDIMENTATION RATE: Sed Rate: 112 mm/h — ABNORMAL HIGH (ref 0–15)

## 2018-04-28 LAB — CALCIUM, IONIZED: Calcium, Ion: 4.89 mg/dL (ref 4.8–5.6)

## 2018-05-11 DIAGNOSIS — L732 Hidradenitis suppurativa: Secondary | ICD-10-CM | POA: Diagnosis not present

## 2018-06-15 DIAGNOSIS — L732 Hidradenitis suppurativa: Secondary | ICD-10-CM | POA: Diagnosis not present

## 2018-07-20 DIAGNOSIS — L732 Hidradenitis suppurativa: Secondary | ICD-10-CM | POA: Diagnosis not present

## 2019-02-23 ENCOUNTER — Other Ambulatory Visit: Payer: Self-pay

## 2019-02-23 DIAGNOSIS — Z20822 Contact with and (suspected) exposure to covid-19: Secondary | ICD-10-CM

## 2019-02-25 LAB — NOVEL CORONAVIRUS, NAA: SARS-CoV-2, NAA: DETECTED — AB

## 2019-02-26 IMAGING — DX DG CHEST 2V
3 series · 3 of 3 positions shown · non-contrast
Comparison: 03/28/2018 and CT 03/29/2018

CLINICAL DATA: Follow-up pneumonia.

EXAM:
CHEST - 2 VIEW

[chest pa (1 of 2)]
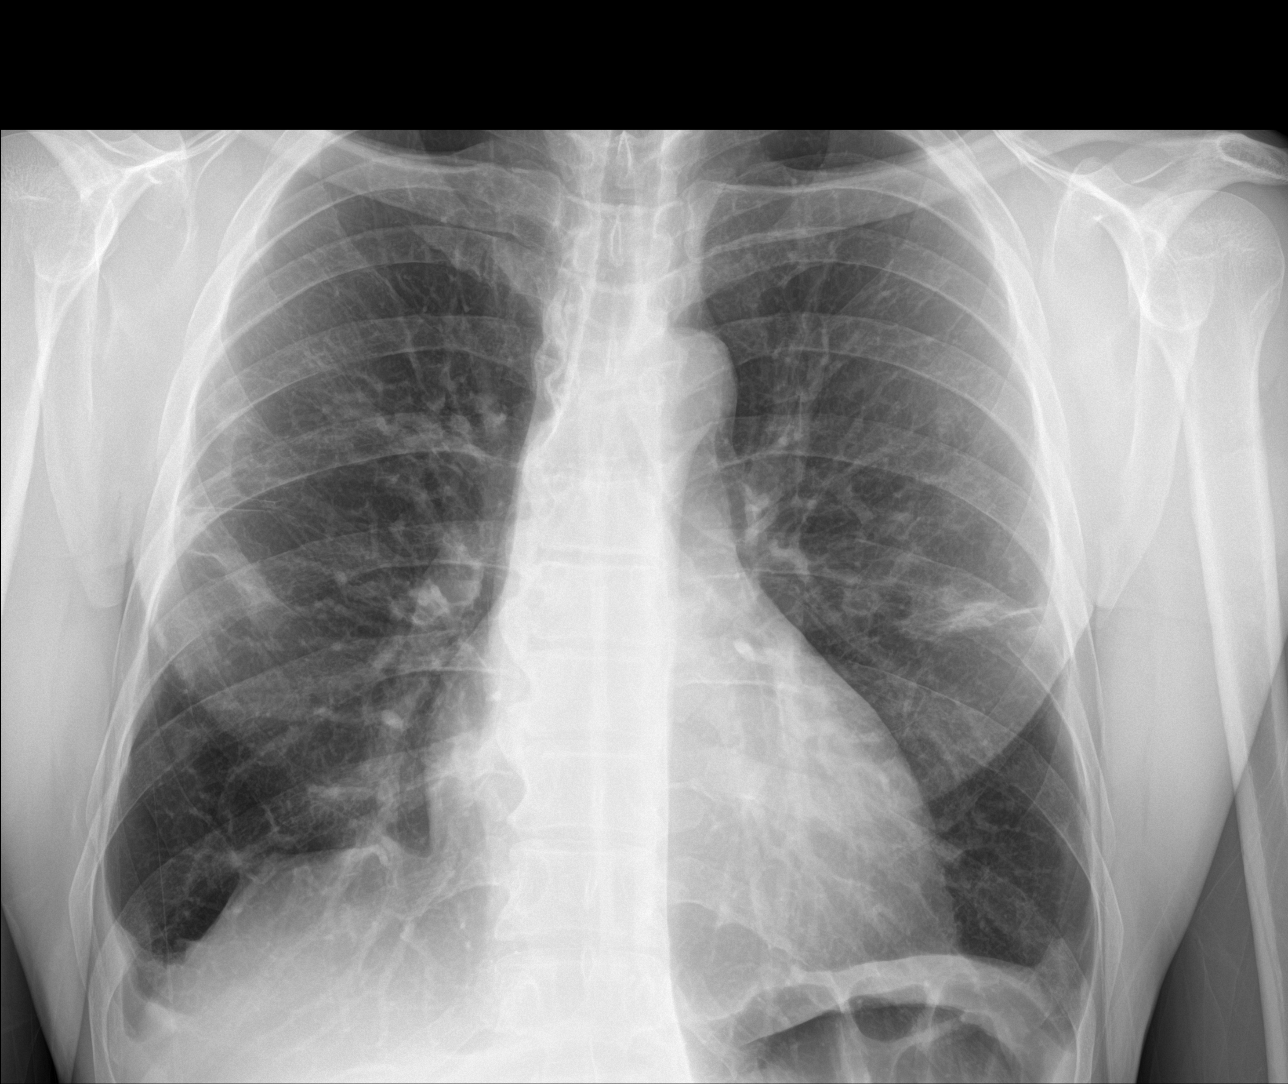

[chest lat]
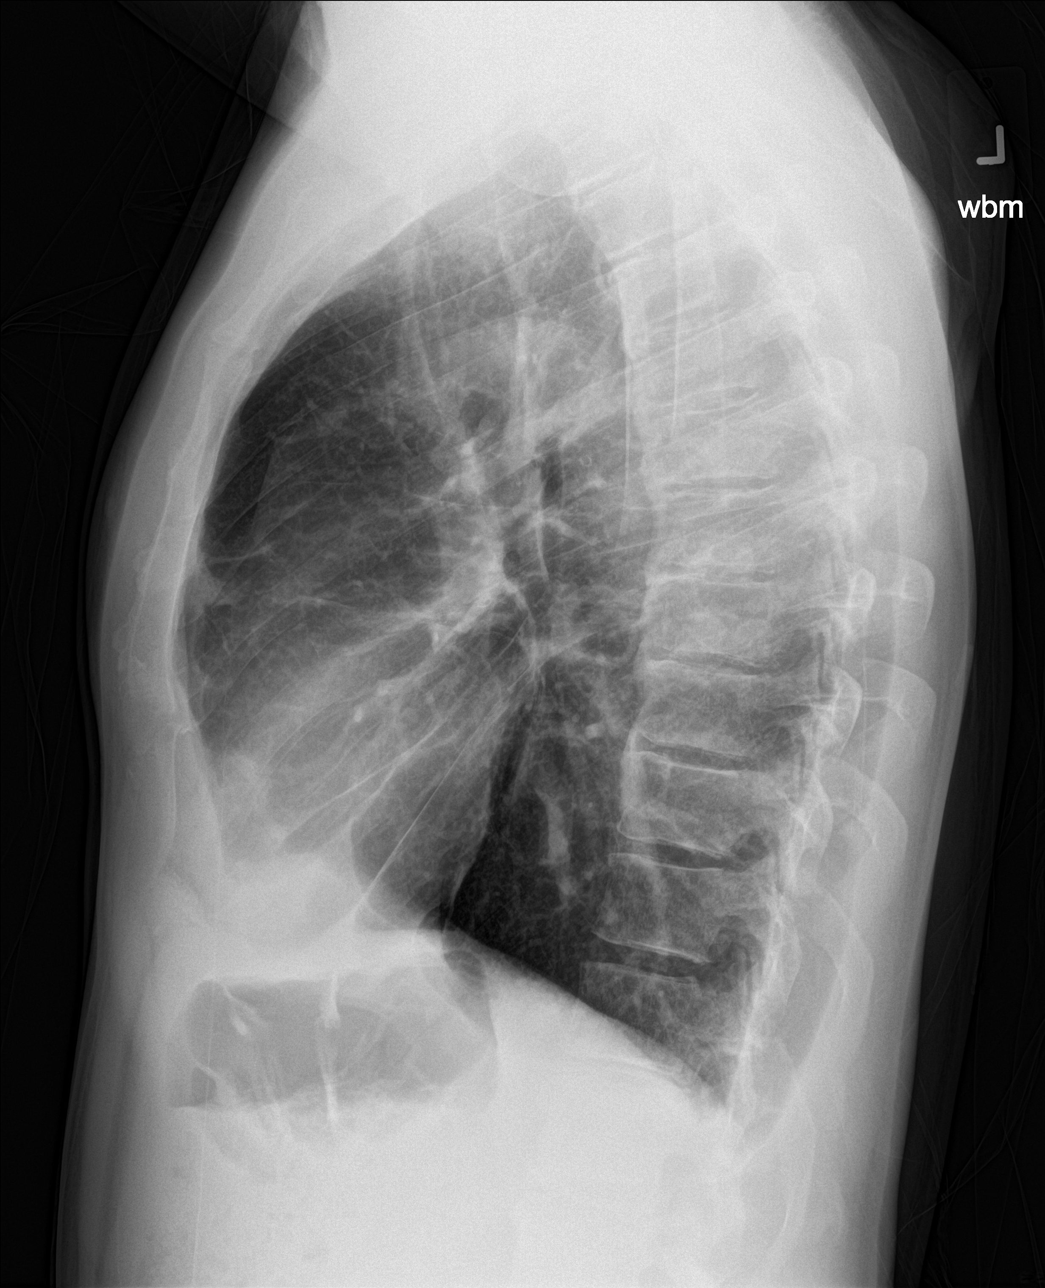

[chest pa (2 of 2)]
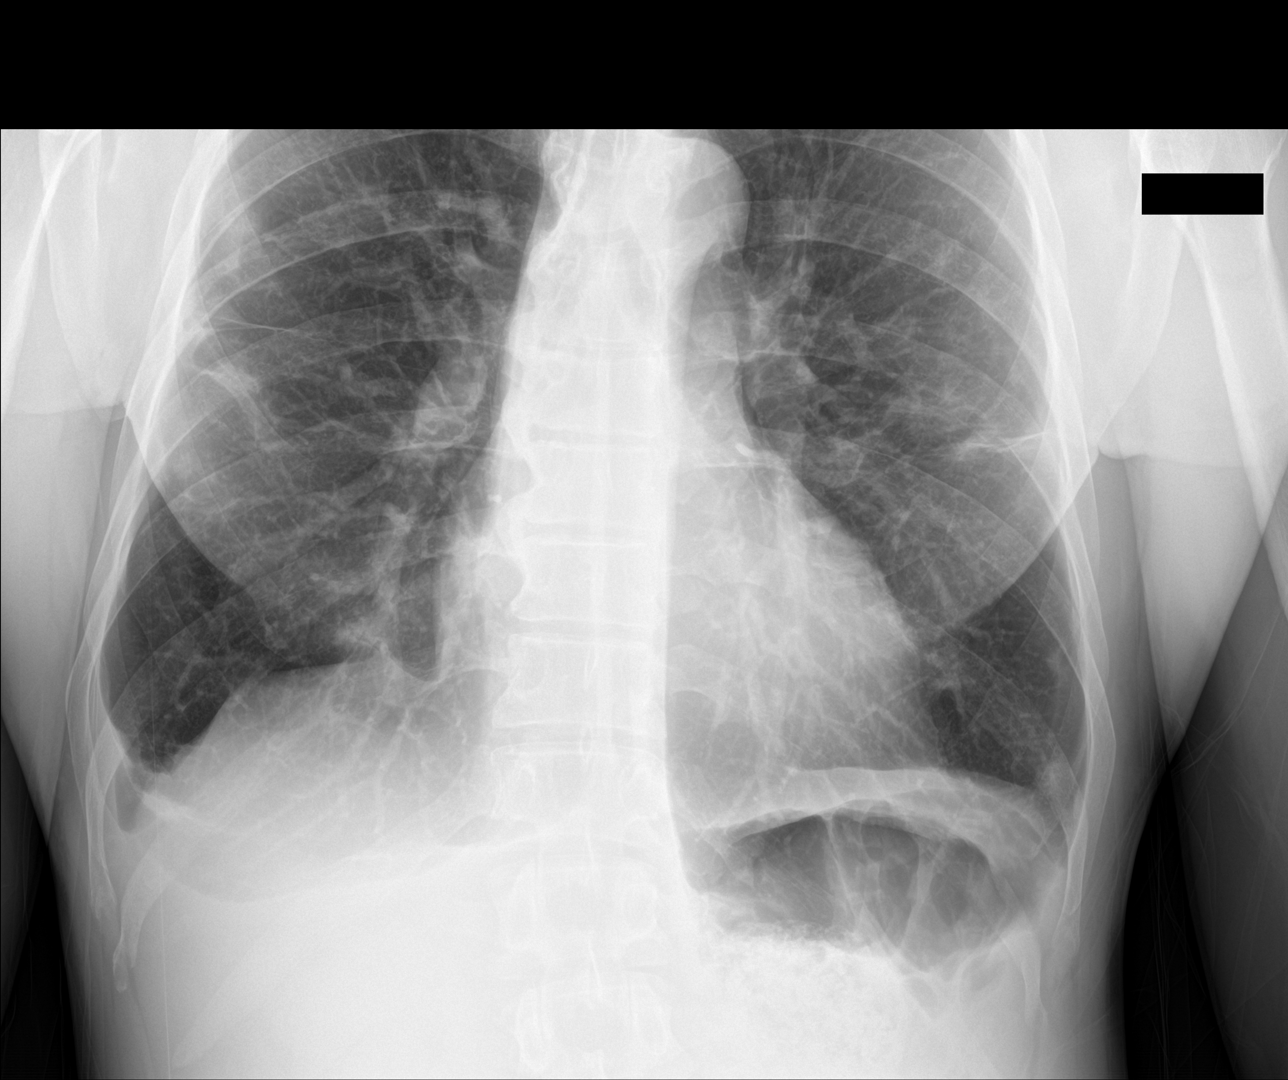

[3 of 3 positions shown; findings below may reference images not displayed]

FINDINGS: Lungs are adequately inflated and demonstrate persistent patchy
bilateral airspace density without significant change. Possible
small right pleural effusion. Cardiomediastinal silhouette and
remainder of the exam is unchanged.
IMPRESSION: Persistent bilateral patchy airspace process as seen on recent CT
without significant change.

## 2019-03-07 ENCOUNTER — Other Ambulatory Visit: Payer: Self-pay

## 2019-03-07 DIAGNOSIS — Z20822 Contact with and (suspected) exposure to covid-19: Secondary | ICD-10-CM

## 2019-03-08 LAB — NOVEL CORONAVIRUS, NAA: SARS-CoV-2, NAA: NOT DETECTED

## 2019-06-24 ENCOUNTER — Ambulatory Visit: Payer: Self-pay | Attending: Internal Medicine

## 2019-06-24 DIAGNOSIS — Z23 Encounter for immunization: Secondary | ICD-10-CM

## 2019-06-24 NOTE — Progress Notes (Signed)
   Covid-19 Vaccination Clinic  Name:  Benjamin Owens    MRN: [id number] DOB: 1970/11/14  06/24/2019  [name] was observed post Covid-19 immunization for 15 minutes without incident. He was provided with Vaccine Information Sheet and instruction to access the V-Safe system.   Mr. Gut was instructed to call 911 with any severe reactions post vaccine: Marland Kitchen Difficulty breathing  . Swelling of face and throat  . A fast heartbeat  . A bad rash all over body  . Dizziness and weakness   Immunizations Administered    Name Date Dose VIS Date Route   Pfizer COVID-19 Vaccine 06/24/2019  9:19 AM 0.3 mL 03/04/2019 Intramuscular   Manufacturer: [employer], [employer]   Lot: [id number]   NDC: [address]-8

## 2019-07-18 ENCOUNTER — Ambulatory Visit: Payer: Self-pay | Attending: Internal Medicine

## 2019-07-18 DIAGNOSIS — Z23 Encounter for immunization: Secondary | ICD-10-CM

## 2019-07-18 NOTE — Progress Notes (Signed)
   Covid-19 Vaccination Clinic  Name:  JEREMIAH CURCI    MRN: [id number] DOB: [birth year]  07/18/2019  Mr. Marek was observed post Covid-19 immunization for 15 minutes without incident. He was provided with Vaccine Information Sheet and instruction to access the V-Safe system.   Mr. Giovanetti was instructed to call 911 with any severe reactions post vaccine: Marland Kitchen Difficulty breathing  . Swelling of face and throat  . A fast heartbeat  . A bad rash all over body  . Dizziness and weakness   Immunizations Administered    Name Date Dose VIS Date Route   Pfizer COVID-19 Vaccine 07/18/2019  2:44 PM 0.3 mL 05/18/2018 Intramuscular   Manufacturer: [employer], [employer]   Lot: [id number]   NDC: [address]-7

## 2019-09-13 ENCOUNTER — Encounter
Admission: EM | Disposition: A | Discharge status: Home / Self Care | Payer: Self-pay | Source: Home / Self Care | Attending: Internal Medicine

## 2019-09-13 ENCOUNTER — Inpatient Hospital Stay
Admission: EM | Admit: 2019-09-13 | Discharge: 2019-09-15 | DRG: 718 | Disposition: A | Discharge status: Home / Self Care | Payer: BC Managed Care – PPO | Attending: Internal Medicine | Admitting: Internal Medicine

## 2019-09-13 ENCOUNTER — Inpatient Hospital Stay: Payer: BC Managed Care – PPO | Admitting: Anesthesiology

## 2019-09-13 ENCOUNTER — Other Ambulatory Visit: Payer: Self-pay

## 2019-09-13 ENCOUNTER — Emergency Department: Payer: BC Managed Care – PPO

## 2019-09-13 DIAGNOSIS — F1020 Alcohol dependence, uncomplicated: Secondary | ICD-10-CM | POA: Diagnosis present

## 2019-09-13 DIAGNOSIS — R509 Fever, unspecified: Secondary | ICD-10-CM | POA: Diagnosis not present

## 2019-09-13 DIAGNOSIS — F1410 Cocaine abuse, uncomplicated: Secondary | ICD-10-CM | POA: Diagnosis not present

## 2019-09-13 DIAGNOSIS — Z801 Family history of malignant neoplasm of trachea, bronchus and lung: Secondary | ICD-10-CM | POA: Diagnosis not present

## 2019-09-13 DIAGNOSIS — F1210 Cannabis abuse, uncomplicated: Secondary | ICD-10-CM | POA: Diagnosis not present

## 2019-09-13 DIAGNOSIS — F17200 Nicotine dependence, unspecified, uncomplicated: Secondary | ICD-10-CM | POA: Diagnosis present

## 2019-09-13 DIAGNOSIS — E876 Hypokalemia: Secondary | ICD-10-CM | POA: Diagnosis not present

## 2019-09-13 DIAGNOSIS — L732 Hidradenitis suppurativa: Secondary | ICD-10-CM | POA: Diagnosis not present

## 2019-09-13 DIAGNOSIS — N492 Inflammatory disorders of scrotum: Principal | ICD-10-CM | POA: Diagnosis present

## 2019-09-13 DIAGNOSIS — Z823 Family history of stroke: Secondary | ICD-10-CM

## 2019-09-13 DIAGNOSIS — Z8042 Family history of malignant neoplasm of prostate: Secondary | ICD-10-CM

## 2019-09-13 DIAGNOSIS — Z833 Family history of diabetes mellitus: Secondary | ICD-10-CM

## 2019-09-13 DIAGNOSIS — Z808 Family history of malignant neoplasm of other organs or systems: Secondary | ICD-10-CM

## 2019-09-13 DIAGNOSIS — Z881 Allergy status to other antibiotic agents status: Secondary | ICD-10-CM | POA: Diagnosis not present

## 2019-09-13 DIAGNOSIS — Z803 Family history of malignant neoplasm of breast: Secondary | ICD-10-CM

## 2019-09-13 DIAGNOSIS — Z7952 Long term (current) use of systemic steroids: Secondary | ICD-10-CM

## 2019-09-13 DIAGNOSIS — F17210 Nicotine dependence, cigarettes, uncomplicated: Secondary | ICD-10-CM | POA: Diagnosis not present

## 2019-09-13 DIAGNOSIS — Z20822 Contact with and (suspected) exposure to covid-19: Secondary | ICD-10-CM | POA: Diagnosis not present

## 2019-09-13 DIAGNOSIS — Z8249 Family history of ischemic heart disease and other diseases of the circulatory system: Secondary | ICD-10-CM

## 2019-09-13 DIAGNOSIS — R140 Abdominal distension (gaseous): Secondary | ICD-10-CM | POA: Diagnosis not present

## 2019-09-13 HISTORY — PX: INCISION AND DRAINAGE ABSCESS: SHX5864

## 2019-09-13 LAB — URINALYSIS, COMPLETE (UACMP) WITH MICROSCOPIC
Bacteria, UA: NONE SEEN
Bilirubin Urine: NEGATIVE
Glucose, UA: NEGATIVE mg/dL
Ketones, ur: NEGATIVE mg/dL
Nitrite: NEGATIVE
Protein, ur: 30 mg/dL — AB
Specific Gravity, Urine: 1.016 (ref 1.005–1.030)
pH: 5 (ref 5.0–8.0)

## 2019-09-13 LAB — URINE DRUG SCREEN, QUALITATIVE (ARMC ONLY)
Amphetamines, Ur Screen: NOT DETECTED
Barbiturates, Ur Screen: NOT DETECTED
Benzodiazepine, Ur Scrn: NOT DETECTED
Cannabinoid 50 Ng, Ur ~~LOC~~: POSITIVE — AB
Cocaine Metabolite,Ur ~~LOC~~: POSITIVE — AB
MDMA (Ecstasy)Ur Screen: NOT DETECTED
Methadone Scn, Ur: NOT DETECTED
Opiate, Ur Screen: NOT DETECTED
Phencyclidine (PCP) Ur S: NOT DETECTED
Tricyclic, Ur Screen: NOT DETECTED

## 2019-09-13 LAB — SARS CORONAVIRUS 2 BY RT PCR (HOSPITAL ORDER, PERFORMED IN ~~LOC~~ HOSPITAL LAB): SARS Coronavirus 2: NEGATIVE

## 2019-09-13 LAB — CBC WITH DIFFERENTIAL/PLATELET
Abs Immature Granulocytes: 0.07 10*3/uL (ref 0.00–0.07)
Basophils Absolute: 0 10*3/uL (ref 0.0–0.1)
Basophils Relative: 0 %
Eosinophils Absolute: 0.2 10*3/uL (ref 0.0–0.5)
Eosinophils Relative: 1 %
HCT: 36.5 % — ABNORMAL LOW (ref 39.0–52.0)
Hemoglobin: 12.5 g/dL — ABNORMAL LOW (ref 13.0–17.0)
Immature Granulocytes: 1 %
Lymphocytes Relative: 18 %
Lymphs Abs: 2.8 10*3/uL (ref 0.7–4.0)
MCH: 26.6 pg (ref 26.0–34.0)
MCHC: 34.2 g/dL (ref 30.0–36.0)
MCV: 77.7 fL — ABNORMAL LOW (ref 80.0–100.0)
Monocytes Absolute: 1.6 10*3/uL — ABNORMAL HIGH (ref 0.1–1.0)
Monocytes Relative: 11 %
Neutro Abs: 10.7 10*3/uL — ABNORMAL HIGH (ref 1.7–7.7)
Neutrophils Relative %: 69 %
Platelets: 333 10*3/uL (ref 150–400)
RBC: 4.7 MIL/uL (ref 4.22–5.81)
RDW: 16.5 % — ABNORMAL HIGH (ref 11.5–15.5)
WBC: 15.4 10*3/uL — ABNORMAL HIGH (ref 4.0–10.5)
nRBC: 0 % (ref 0.0–0.2)

## 2019-09-13 LAB — COMPREHENSIVE METABOLIC PANEL
ALT: 14 U/L (ref 0–44)
AST: 22 U/L (ref 15–41)
Albumin: 2.6 g/dL — ABNORMAL LOW (ref 3.5–5.0)
Alkaline Phosphatase: 201 U/L — ABNORMAL HIGH (ref 38–126)
Anion gap: 8 (ref 5–15)
BUN: 8 mg/dL (ref 6–20)
CO2: 24 mmol/L (ref 22–32)
Calcium: 8.3 mg/dL — ABNORMAL LOW (ref 8.9–10.3)
Chloride: 102 mmol/L (ref 98–111)
Creatinine, Ser: 0.92 mg/dL (ref 0.61–1.24)
GFR calc Af Amer: >60 mL/min (ref >60)
GFR calc non Af Amer: >60 mL/min (ref >60)
Glucose, Bld: 140 mg/dL — ABNORMAL HIGH (ref 70–99)
Potassium: 3.3 mmol/L — ABNORMAL LOW (ref 3.5–5.1)
Sodium: 134 mmol/L — ABNORMAL LOW (ref 135–145)
Total Bilirubin: 1 mg/dL (ref 0.3–1.2)
Total Protein: 9.3 g/dL — ABNORMAL HIGH (ref 6.5–8.1)

## 2019-09-13 LAB — PHOSPHORUS: Phosphorus: 3.6 mg/dL (ref 2.5–4.6)

## 2019-09-13 LAB — POC SARS CORONAVIRUS 2 AG: SARS Coronavirus 2 Ag: NEGATIVE

## 2019-09-13 LAB — MAGNESIUM: Magnesium: 1.9 mg/dL (ref 1.7–2.4)

## 2019-09-13 LAB — LACTIC ACID, PLASMA: Lactic Acid, Venous: 1.5 mmol/L (ref 0.5–1.9)

## 2019-09-13 SURGERY — INCISION AND DRAINAGE, ABSCESS
Anesthesia: General | Site: Groin | Laterality: Right

## 2019-09-13 MED ORDER — ONDANSETRON HCL 4 MG/2ML IJ SOLN: 4.0000 mg | Freq: Four times a day (QID) | INTRAMUSCULAR | Status: DC | PRN

## 2019-09-13 MED ORDER — SODIUM CHLORIDE 0.9% FLUSH: 3.0000 mL | Freq: Once | INTRAVENOUS | Status: DC

## 2019-09-13 MED ORDER — THIAMINE HCL 100 MG/ML IJ SOLN: 100.0000 mg | Freq: Every day | INTRAMUSCULAR | Status: DC

## 2019-09-13 MED ORDER — ONDANSETRON HCL 4 MG PO TABS: 4.0000 mg | ORAL_TABLET | Freq: Four times a day (QID) | ORAL | Status: DC | PRN

## 2019-09-13 MED ORDER — HYDROMORPHONE HCL 1 MG/ML IJ SOLN
1.0000 mg | INTRAMUSCULAR | Status: DC | PRN
  Administered 2019-09-13 – 2019-09-15 (×5): 1 mg via INTRAVENOUS
  Filled 2019-09-13 (×5): qty 1

## 2019-09-13 MED ORDER — ADULT MULTIVITAMIN W/MINERALS CH
1.0000 | ORAL_TABLET | Freq: Every day | ORAL | Status: DC
  Administered 2019-09-14 – 2019-09-15 (×2): 1 via ORAL
  Filled 2019-09-13 (×2): qty 1

## 2019-09-13 MED ORDER — ACETAMINOPHEN 325 MG PO TABS: 650.0000 mg | ORAL_TABLET | Freq: Four times a day (QID) | ORAL | Status: DC | PRN

## 2019-09-13 MED ORDER — POTASSIUM CHLORIDE 10 MEQ/100ML IV SOLN: 10.0000 meq | INTRAVENOUS | Status: AC

## 2019-09-13 MED ORDER — FENTANYL CITRATE (PF) 100 MCG/2ML IJ SOLN
INTRAMUSCULAR | Status: AC
  Filled 2019-09-13: qty 2

## 2019-09-13 MED ORDER — CLINDAMYCIN PHOSPHATE 900 MG/50ML IV SOLN
900.0000 mg | Freq: Once | INTRAVENOUS | Status: AC
  Administered 2019-09-13: 900 mg via INTRAVENOUS
  Filled 2019-09-13: qty 50

## 2019-09-13 MED ORDER — THIAMINE HCL 100 MG PO TABS
100.0000 mg | ORAL_TABLET | Freq: Every day | ORAL | Status: DC
  Administered 2019-09-14 – 2019-09-15 (×2): 100 mg via ORAL
  Filled 2019-09-13 (×2): qty 1

## 2019-09-13 MED ORDER — SODIUM CHLORIDE 0.9 % IV SOLN: INTRAVENOUS | Status: DC

## 2019-09-13 MED ORDER — IOHEXOL 300 MG/ML  SOLN
100.0000 mL | Freq: Once | INTRAMUSCULAR | Status: AC | PRN
  Administered 2019-09-13: 100 mL via INTRAVENOUS

## 2019-09-13 MED ORDER — LORAZEPAM 1 MG PO TABS: 1.0000 mg | ORAL_TABLET | ORAL | Status: DC | PRN

## 2019-09-13 MED ORDER — CEFAZOLIN SODIUM-DEXTROSE 2-4 GM/100ML-% IV SOLN
2.0000 g | Freq: Three times a day (TID) | INTRAVENOUS | Status: DC
  Administered 2019-09-13 – 2019-09-14 (×3): 2 g via INTRAVENOUS
  Filled 2019-09-13 (×6): qty 100

## 2019-09-13 MED ORDER — FENTANYL CITRATE (PF) 100 MCG/2ML IJ SOLN
INTRAMUSCULAR | Status: DC | PRN
  Administered 2019-09-13 – 2019-09-14 (×3): 50 ug via INTRAVENOUS

## 2019-09-13 MED ORDER — PROPOFOL 10 MG/ML IV BOLUS
INTRAVENOUS | Status: DC | PRN
  Administered 2019-09-13: 150 mg via INTRAVENOUS
  Administered 2019-09-13 – 2019-09-14 (×3): 20 mg via INTRAVENOUS

## 2019-09-13 MED ORDER — LIDOCAINE HCL (CARDIAC) PF 100 MG/5ML IV SOSY
PREFILLED_SYRINGE | INTRAVENOUS | Status: DC | PRN
  Administered 2019-09-13: 100 mg via INTRAVENOUS

## 2019-09-13 MED ORDER — SUCCINYLCHOLINE CHLORIDE 20 MG/ML IJ SOLN
INTRAMUSCULAR | Status: DC | PRN
  Administered 2019-09-13: 50 mg via INTRAVENOUS

## 2019-09-13 MED ORDER — BUPIVACAINE HCL (PF) 0.5 % IJ SOLN
INTRAMUSCULAR | Status: AC
  Filled 2019-09-13: qty 30

## 2019-09-13 MED ORDER — ONDANSETRON HCL 4 MG/2ML IJ SOLN
4.0000 mg | Freq: Once | INTRAMUSCULAR | Status: AC
  Administered 2019-09-13: 4 mg via INTRAVENOUS
  Filled 2019-09-13: qty 2

## 2019-09-13 MED ORDER — ACETAMINOPHEN 650 MG RE SUPP: 650.0000 mg | Freq: Four times a day (QID) | RECTAL | Status: DC | PRN

## 2019-09-13 MED ORDER — MIDAZOLAM HCL 2 MG/2ML IJ SOLN
INTRAMUSCULAR | Status: AC
  Filled 2019-09-13: qty 2

## 2019-09-13 MED ORDER — FOLIC ACID 1 MG PO TABS
1.0000 mg | ORAL_TABLET | Freq: Every day | ORAL | Status: DC
  Administered 2019-09-14 – 2019-09-15 (×2): 1 mg via ORAL
  Filled 2019-09-13 (×2): qty 1

## 2019-09-13 MED ORDER — PROPOFOL 10 MG/ML IV BOLUS
INTRAVENOUS | Status: AC
  Filled 2019-09-13: qty 20

## 2019-09-13 MED ORDER — LORAZEPAM 2 MG/ML IJ SOLN: 1.0000 mg | INTRAMUSCULAR | Status: DC | PRN

## 2019-09-13 MED ORDER — HYDROMORPHONE HCL 1 MG/ML IJ SOLN
0.5000 mg | Freq: Once | INTRAMUSCULAR | Status: AC
  Administered 2019-09-13: 0.5 mg via INTRAVENOUS
  Filled 2019-09-13: qty 1

## 2019-09-13 MED ORDER — LIDOCAINE HCL (PF) 1 % IJ SOLN
INTRAMUSCULAR | Status: AC
  Filled 2019-09-13: qty 30

## 2019-09-13 MED ORDER — MIDAZOLAM HCL 2 MG/2ML IJ SOLN
INTRAMUSCULAR | Status: DC | PRN
  Administered 2019-09-13: 2 mg via INTRAVENOUS

## 2019-09-13 SURGICAL SUPPLY — 36 items
BNDG CONFORM 2 STRL LF (GAUZE/BANDAGES/DRESSINGS) IMPLANT
CANISTER SUCT 1200ML W/VALVE (MISCELLANEOUS) ×2 IMPLANT
CHLORAPREP W/TINT 26 (MISCELLANEOUS) IMPLANT
COVER WAND RF STERILE (DRAPES) ×2 IMPLANT
DERMABOND ADVANCED (GAUZE/BANDAGES/DRESSINGS)
DERMABOND ADVANCED .7 DNX12 (GAUZE/BANDAGES/DRESSINGS) IMPLANT
DRAIN PENROSE 1/4X12 LTX STRL (WOUND CARE) ×2 IMPLANT
DRAPE LAPAROTOMY 77X122 PED (DRAPES) ×2 IMPLANT
DRSG GAUZE FLUFF 36X18 (GAUZE/BANDAGES/DRESSINGS) IMPLANT
ELECT REM PT RETURN 9FT ADLT (ELECTROSURGICAL) ×2
ELECTRODE REM PT RTRN 9FT ADLT (ELECTROSURGICAL) ×1 IMPLANT
GAUZE PACKING IODOFORM 1X5 (PACKING) ×2 IMPLANT
GAUZE SPONGE 4X4 12PLY STRL (GAUZE/BANDAGES/DRESSINGS) IMPLANT
GLOVE BIOGEL PI IND STRL 7.5 (GLOVE) ×1 IMPLANT
GLOVE BIOGEL PI INDICATOR 7.5 (GLOVE) ×1
GOWN STRL REUS W/ TWL LRG LVL3 (GOWN DISPOSABLE) ×1 IMPLANT
GOWN STRL REUS W/ TWL XL LVL3 (GOWN DISPOSABLE) ×1 IMPLANT
GOWN STRL REUS W/TWL LRG LVL3 (GOWN DISPOSABLE) ×2
GOWN STRL REUS W/TWL XL LVL3 (GOWN DISPOSABLE) ×2
KIT TURNOVER KIT A (KITS) ×2 IMPLANT
LABEL OR SOLS (LABEL) ×2 IMPLANT
NEEDLE HYPO 25X1 1.5 SAFETY (NEEDLE) ×2 IMPLANT
NS IRRIG 500ML POUR BTL (IV SOLUTION) ×2 IMPLANT
PACK BASIN MINOR (MISCELLANEOUS) ×2 IMPLANT
PAD ABD DERMACEA PRESS 5X9 (GAUZE/BANDAGES/DRESSINGS) ×2 IMPLANT
SOL PREP PVP 2OZ (MISCELLANEOUS)
SOLUTION PREP PVP 2OZ (MISCELLANEOUS) IMPLANT
SUPPORETR ATHLETIC LG (MISCELLANEOUS) IMPLANT
SUPPORTER ATHLETIC LG (MISCELLANEOUS)
SUT ETHILON 3-0 FS-10 30 BLK (SUTURE) ×2
SUT VIC AB 3-0 SH 27 (SUTURE) ×4
SUT VIC AB 3-0 SH 27X BRD (SUTURE) ×2 IMPLANT
SUT VIC AB 4-0 SH 27 (SUTURE) ×2
SUT VIC AB 4-0 SH 27XANBCTRL (SUTURE) ×1 IMPLANT
SUTURE EHLN 3-0 FS-10 30 BLK (SUTURE) ×1 IMPLANT
SYR 10ML LL (SYRINGE) ×2 IMPLANT

## 2019-09-13 NOTE — ED Provider Notes (Signed)
[facility] Emergency Department Provider Note  ____________________________________________   First MD Initiated Contact with Patient 09/13/19 1415     (approximate)  I have reviewed the triage vital signs and the nursing notes.   HISTORY  Chief Complaint Abscess    HPI Benjamin Owens is a 49 y.o. male with hidradenitis suppurative who comes in with abscess on his testicle.  Patient reports having abscesses mostly in his groins or his bottom.  He denies ever having any testicle issues.  He states over the past 3 days has had increasing swelling to his bilateral testicles but mostly in the right.  It is constant, severe, nothing makes better, nothing makes it worse.  Associated with pain.  Denies having fevers.          Past Medical History:  Diagnosis Date  . Hay fever   . Hidradenitis suppurativa     Patient Active Problem List   Diagnosis Date Noted  . Chest pain 03/29/2018  . Nicotine dependence, uncomplicated 46/27/0350  . Hidradenitis suppurativa 01/17/2013    Past Surgical History:  Procedure Laterality Date  . NO PAST SURGERIES      Prior to Admission medications   Medication Sig Start Date End Date Taking? Authorizing Provider  calcium carbonate (OS-CAL) 600 MG TABS tablet Take 1 tablet (600 mg total) by mouth 2 (two) times daily with a meal for 7 days. 04/20/18 04/27/18  Arta Silence, MD  clindamycin (CLEOCIN) 300 MG capsule Take 300 mg by mouth 2 (two) times daily.  11/25/17   [provider]  gabapentin (NEURONTIN) 300 MG capsule Take 300 mg by mouth as needed.  10/07/17   [provider]  inFLIXimab (REMICADE) 100 MG injection Inject into the vein.    [provider]  potassium chloride (K-DUR) 10 MEQ tablet Take 1 tablet (10 mEq total) by mouth 2 (two) times daily for 7 days. 04/20/18 04/27/18  Arta Silence, MD  predniSONE (DELTASONE) 10 MG tablet Day 1-2 take 6 pills. Day 3 take 5 pills then  reduce by 1 pill each day. 04/26/18   Mikey College, NP    Allergies Vancomycin and Azithromycin  Family History  Problem Relation Age of Onset  . Stroke Mother   . Heart disease Mother        Degenerative heart disease  . Diabetes Mother   . Throat cancer Father   . Lung cancer Maternal Aunt   . Breast cancer Paternal Aunt   . Lung cancer Paternal Uncle        smoker  . Healthy Maternal Grandmother   . Heart attack Maternal Grandfather   . Breast cancer Paternal Grandmother   . Heart attack Paternal Grandfather   . Healthy Cousin   . Prostate cancer Paternal Uncle   . Breast cancer Maternal Aunt     Social History Social History   Tobacco Use  . Smoking status: Current Every Day Smoker    Packs/day: 0.25    Types: Cigarettes  . Smokeless tobacco: Never Used  Vaping Use  . Vaping Use: Never used  Substance Use Topics  . Alcohol use: Yes    Alcohol/week: 3.0 standard drinks    Types: 3 Cans of beer per week    Comment: weekdays 3 x 16 oz beers; Weekend approx 2 cases over Sat/Sun.  . Drug use: Yes    Types: Marijuana      Review of Systems Constitutional: No fever/chills Eyes: No visual changes. ENT: No  sore throat. Cardiovascular: Denies chest pain. Respiratory: Denies shortness of breath. Gastrointestinal: No abdominal pain.  No nausea, no vomiting.  No diarrhea.  No constipation. Genitourinary: Negative for dysuria.  Positive groin swelling Musculoskeletal: Negative for back pain. Skin: Negative for rash. Neurological: Negative for headaches, focal weakness or numbness. All other ROS negative ____________________________________________   PHYSICAL EXAM:  VITAL SIGNS: ED Triage Vitals  Enc Vitals Group     BP 09/13/19 1229 (!) 115/94     Pulse Rate 09/13/19 1229 88     Resp 09/13/19 1229 17     Temp 09/13/19 1229 99 F (37.2 C)     Temp Source 09/13/19 1229 Oral     SpO2 09/13/19 1229 100 %     Weight 09/13/19 1230 175 lb (79.4 kg)      Height 09/13/19 1230 6' (1.829 m)     Head Circumference --      Peak Flow --      Pain Score 09/13/19 1230 10     Pain Loc --      Pain Edu? --      Excl. in GC? --     Constitutional: Alert and oriented. Well appearing and in no acute distress. Eyes: Conjunctivae are normal. EOMI. Head: Atraumatic. Nose: No congestion/rhinnorhea. Mouth/Throat: Mucous membranes are moist.   Neck: No stridor. Trachea Midline. FROM Cardiovascular: Normal rate, regular rhythm. Grossly normal heart sounds.  Good peripheral circulation. Respiratory: Normal respiratory effort.  No retractions. Lungs CTAB. Gastrointestinal: Soft and nontender. No distention. No abdominal bruits.  Musculoskeletal: No lower extremity tenderness nor edema.  No joint effusions. Neurologic:  Normal speech and language. No gross focal neurologic deficits are appreciated.  Skin:  Skin is warm, dry and intact. No rash noted. Psychiatric: Mood and affect are normal. Speech and behavior are normal. GU: Noticeable groin swelling to the testicles bilaterally but worse on the right testicle with concern for abscess.  No crepitus felt  ____________________________________________   LABS (all labs ordered are listed, but only abnormal results are displayed)  Labs Reviewed  COMPREHENSIVE METABOLIC PANEL - Abnormal; Notable for the following components:      Result Value   Sodium 134 (*)    Potassium 3.3 (*)    Glucose, Bld 140 (*)    Calcium 8.3 (*)    Total Protein 9.3 (*)    Albumin 2.6 (*)    Alkaline Phosphatase 201 (*)    All other components within normal limits  CBC WITH DIFFERENTIAL/PLATELET - Abnormal; Notable for the following components:   WBC 15.4 (*)    Hemoglobin 12.5 (*)    HCT 36.5 (*)    MCV 77.7 (*)    RDW 16.5 (*)    Neutro Abs 10.7 (*)    Monocytes Absolute 1.6 (*)    All other components within normal limits  URINALYSIS, COMPLETE (UACMP) WITH MICROSCOPIC - Abnormal; Notable for the following  components:   Color, Urine YELLOW (*)    APPearance CLEAR (*)    Hgb urine dipstick SMALL (*)    Protein, ur 30 (*)    Leukocytes,Ua TRACE (*)    All other components within normal limits   ____________________________________________   RADIOLOGY   Official radiology report(s): CT ABDOMEN PELVIS W CONTRAST  Result Date: 09/13/2019 CLINICAL DATA:  49 year old male with abdominal distension. Scrotal abscess. EXAM: CT ABDOMEN AND PELVIS WITH CONTRAST TECHNIQUE: Multidetector CT imaging of the abdomen and pelvis was performed using the standard protocol following bolus administration of  intravenous contrast. CONTRAST:  OMNIPAQUE IOHEXOL 300 MG/ML  SOLN COMPARISON:  CT abdomen pelvis dated 11/24/2017. FINDINGS: Lower chest: The visualized lung bases are clear. No intra-abdominal free air or free fluid. Hepatobiliary: No focal liver abnormality is seen. No gallstones, gallbladder wall thickening, or biliary dilatation. Pancreas: Unremarkable. No pancreatic ductal dilatation or surrounding inflammatory changes. Spleen: Normal in size without focal abnormality. Adrenals/Urinary Tract: The adrenal glands are unremarkable. There is no hydronephrosis on either side. There is mild right perinephric haziness with faint areas of heterogeneous enhancement in the upper right renal parenchyma concerning for pyelonephritis. Correlation with urinalysis recommended. No drainable fluid collection or abscess. Indeterminate 1 cm hypodense lesion in the inferior pole of the right kidney similar to prior CT. This can be better evaluated with MRI as suggested on the ultrasound of 11/24/2017. The visualized ureters and urinary bladder are grossly unremarkable. Stomach/Bowel: There is no bowel obstruction or active inflammation. The appendix is normal. Vascular/Lymphatic: Mild aortoiliac atherosclerotic disease. The IVC is unremarkable. No portal venous gas. There is no adenopathy. Reproductive: The prostate and seminal  vesicles are grossly unremarkable. There is diffuse scrotal wall thickening and edema. There is a 3.6 x 3.7 cm fluid collection in the right scrotal wall most consistent with an abscess. This can be better evaluated with ultrasound. Other: There is diffuse lobulated thickening of the subcutaneous soft tissues of the gluteal region with small pockets of air as seen on the prior CT. This is of indeterminate etiology and may be related to liposuction but concerning for a superimposed infection. Clinical correlation is recommended. No drainable fluid collection identified. Musculoskeletal: No acute or significant osseous findings. IMPRESSION: 1. Diffuse scrotal wall thickening and edema with a 3.6 x 3.7 cm right scrotal wall abscess. This can be better evaluated with ultrasound. 2. Diffuse lobulated thickening of the subcutaneous soft tissues of the gluteal region with small pockets of air as seen on the prior CT. This is of indeterminate etiology and may be related to liposuction but concerning for a superimposed infection. Clinical correlation is recommended. No drainable fluid collection identified. 3. Findings concerning for right-sided pyelonephritis. Correlation with urinalysis recommended. No drainable fluid collection or abscess. 4. No bowel obstruction. Normal appendix. 5. Aortic Atherosclerosis (ICD10-I70.0). Electronically Signed   By: Elgie Collard M.D.   On: 09/13/2019 15:55    ____________________________________________   PROCEDURES  Procedure(s) performed (including Critical Care):  Procedures   ____________________________________________   INITIAL IMPRESSION / ASSESSMENT AND PLAN / ED COURSE  Burnis L Wehling was evaluated in Emergency Department on 09/13/2019 for the symptoms described in the history of present illness. He was evaluated in the context of the global COVID-19 pandemic, which necessitated consideration that the patient might be at risk for infection with the  SARS-CoV-2 virus that causes COVID-19. Institutional protocols and algorithms that pertain to the evaluation of patients at risk for COVID-19 are in a state of rapid change based on information released by regulatory bodies including the CDC and federal and state organizations. These policies and algorithms were followed during the patient's care in the ED.    Patient is a 49 year old who comes in with right greater than left testicle swelling concerning for cellulitis versus abscess.  Will get CT scan to make sure is no evidence of Fournier's gangrene.  Will get blood cultures to evaluate for bacteremia.  We will give a dose of IV pain medicine for patient's significant pain  CT scan concerning for abscess and patient was seen by  Dr. Lonna Cobb and plan to go to the OR for incision and drainage.  Patient started on IV Clinda admitted to the hospital       ____________________________________________   FINAL CLINICAL IMPRESSION(S) / ED DIAGNOSES   Final diagnoses:  Scrotal abscess      MEDICATIONS GIVEN DURING THIS VISIT:  Medications  sodium chloride flush (NS) 0.9 % injection 3 mL ( Intravenous MAR Unhold 09/14/19 0139)  0.9 %  sodium chloride infusion ( Intravenous Rate/Dose Change 09/13/19 2302)  ceFAZolin (ANCEF) IVPB 2g/100 mL premix (2 g Intravenous New Bag/Given 09/14/19 0517)  acetaminophen (TYLENOL) tablet 650 mg ( Oral MAR Unhold 09/14/19 0139)    Or  acetaminophen (TYLENOL) suppository 650 mg ( Rectal MAR Unhold 09/14/19 0139)  HYDROmorphone (DILAUDID) injection 1 mg (1 mg Intravenous Given 09/14/19 0304)  ondansetron (ZOFRAN) tablet 4 mg ( Oral MAR Unhold 09/14/19 0139)    Or  ondansetron (ZOFRAN) injection 4 mg ( Intravenous MAR Unhold 09/14/19 0139)  LORazepam (ATIVAN) tablet 1-4 mg ( Oral MAR Unhold 09/14/19 0139)    Or  LORazepam (ATIVAN) injection 1-4 mg ( Intravenous MAR Unhold 09/14/19 0139)  thiamine tablet 100 mg ( Oral MAR Unhold 09/14/19 0139)    Or  thiamine (B-1)  injection 100 mg ( Intravenous MAR Unhold 09/14/19 0139)  folic acid (FOLVITE) tablet 1 mg ( Oral MAR Unhold 09/14/19 0139)  multivitamin with minerals tablet 1 tablet ( Oral MAR Unhold 09/14/19 0139)  potassium chloride 10 mEq in 100 mL IVPB (has no administration in time range)  0.9 %  sodium chloride infusion ( Intravenous New Bag/Given 09/14/19 0030)  HYDROmorphone (DILAUDID) injection 0.5 mg (0.5 mg Intravenous Given 09/13/19 1505)  ondansetron (ZOFRAN) injection 4 mg (4 mg Intravenous Given 09/13/19 1504)  iohexol (OMNIPAQUE) 300 MG/ML solution 100 mL (100 mLs Intravenous Contrast Given 09/13/19 1520)  clindamycin (CLEOCIN) IVPB 900 mg (0 mg Intravenous Stopped 09/13/19 1649)     ED Discharge Orders    None       Note:  This document was prepared using Dragon voice recognition software and may include unintentional dictation errors.   Concha Se, MD 09/14/19 [phone]

## 2019-09-13 NOTE — Consult Note (Signed)
Urology Consult  Requesting physician: Dr. Mayford Knife  Reason for consultation: Scrotal abscess  Chief Complaint: Scrotal pain  History of Present Illness: Benjamin Owens is a 49 y.o. male with a long history of hidradenitis suppurativa.  Followed by dermatology at [facility] and was on regular Remicade injections.  On record review it does not appear that he has received a recent injection.  He was scheduled to receive April 2021 however I do not see any notation in Epic that it was administered.  Primary lesions are in the buttocks and groin areas.    He presents with a 4-5-day history of right scrotal pain and swelling.  Denies fever or chills.  Denies previous scrotal abscess or prior urologic problems.  He has no bothersome lower urinary tract symptoms.  Denies dysuria or gross hematuria.  CT of the abdomen/pelvis with contrast was performed which showed a >3 cm anterior scrotal wall abscess.  He was incidentally noted to have mild perinephric haziness and enhancement on the right kidney radiographically concerning for pyelonephritis however he has no clinical symptoms of pyelonephritis.  There was also an indeterminate lesion in the right lower pole which was also present on a CT of 2019.   Past Medical History:  Diagnosis Date  . Hay fever   . Hidradenitis suppurativa     Past Surgical History:  Procedure Laterality Date  . NO PAST SURGERIES      Home Medications:  Current Meds  Medication Sig  . clindamycin (CLEOCIN) 300 MG capsule Take 300 mg by mouth 3 (three) times daily.    Allergies:  Allergies  Allergen Reactions  . Vancomycin Itching    Itching at IV site and redness (pt tolerated with IV benadryl)  . Azithromycin Itching    Pt arm itching after receiving IV Zithromax    Family History  Problem Relation Age of Onset  . Stroke Mother   . Heart disease Mother        Degenerative heart disease  . Diabetes Mother   . Throat cancer Father   . Lung cancer  Maternal Aunt   . Breast cancer Paternal Aunt   . Lung cancer Paternal Uncle        smoker  . Healthy Maternal Grandmother   . Heart attack Maternal Grandfather   . Breast cancer Paternal Grandmother   . Heart attack Paternal Grandfather   . Healthy Cousin   . Prostate cancer Paternal Uncle   . Breast cancer Maternal Aunt     Social History:  reports that he has been smoking cigarettes. He has been smoking about 0.25 packs per day. He has never used smokeless tobacco. He reports current alcohol use of about 3.0 standard drinks of alcohol per week. He reports current drug use. Drug: Marijuana.  ROS: A complete review of systems was performed.  All systems are negative except for pertinent findings as noted.  Physical Exam:  Vital signs in last 24 hours: Temp:  [98.2 F (36.8 C)-99 F (37.2 C)] 98.5 F (36.9 C) (06/22 1943) Pulse Rate:  [54-88] 67 (06/22 1943) Resp:  [12-18] 12 (06/22 1842) BP: (104-136)/(71-94) 122/83 (06/22 1943) SpO2:  [95 %-100 %] 100 % (06/22 1943) Weight:  [79.4 kg] 79.4 kg (06/22 1230) Constitutional:  Alert and oriented, No acute distress HEENT: [place] AT, moist mucus membranes.  Trachea midline, no masses Cardiovascular: Regular rate and rhythm, no clubbing, cyanosis, or edema. Respiratory: Normal respiratory effort, lungs clear bilaterally GI: Abdomen is soft, nontender,  nondistended, no abdominal masses GU: Large, fluctuant area anterior right hemiscrotum without drainage.  Induration posterior to the fluctuant area.  Moderate tenderness.  Indurated masses right inguinal region, not draining which patient states are chronic Skin: No rashes, bruises or suspicious lesions Lymph: No cervical or inguinal adenopathy Neurologic: Grossly intact, no focal deficits, moving all 4 extremities Psychiatric: Normal mood and affect   Laboratory Data:  Recent Labs    09/13/19 1233  WBC 15.4*  HGB 12.5*  HCT 36.5*   Recent Labs    09/13/19 1233  NA 134*  K  3.3*  CL 102  CO2 24  GLUCOSE 140*  BUN 8  CREATININE 0.92  CALCIUM 8.3*   No results for input(s): LABPT, INR in the last 72 hours. No results for input(s): LABURIN in the last 72 hours. Results for orders placed or performed during the hospital encounter of 09/13/19  SARS Coronavirus 2 by RT PCR (hospital order, performed in [facility] hospital lab) Nasopharyngeal Nasopharyngeal Swab     Status: None   Collection Time: 09/13/19  4:16 PM   Specimen: Nasopharyngeal Swab  Result Value Ref Range Status   SARS Coronavirus 2 NEGATIVE NEGATIVE Final    Comment: (NOTE) SARS-CoV-2 target nucleic acids are NOT DETECTED.  The SARS-CoV-2 RNA is generally detectable in upper and lower respiratory specimens during the acute phase of infection. The lowest concentration of SARS-CoV-2 viral copies this assay can detect is 250 copies / mL. A negative result does not preclude SARS-CoV-2 infection and should not be used as the sole basis for treatment or other patient management decisions.  A negative result may occur with improper specimen collection / handling, submission of specimen other than nasopharyngeal swab, presence of viral mutation(s) within the areas targeted by this assay, and inadequate number of viral copies (<250 copies / mL). A negative result must be combined with clinical observations, patient history, and epidemiological information.  Fact Sheet for Patients:   StrictlyIdeas.no  Fact Sheet for Healthcare Providers: BankingDealers.co.za  This test is not yet approved or  cleared by the [place] FDA and has been authorized for detection and/or diagnosis of SARS-CoV-2 by FDA under an Emergency Use Authorization (EUA).  This EUA will remain in effect (meaning this test can be used) for the duration of the COVID-19 declaration under Section 564(b)(1) of the Act, 21 U.S.C. section 360bbb-3(b)(1), unless the authorization is  terminated or revoked sooner.  Performed at [facility], [address], [place], [place] [address]      Radiologic Imaging: Images personally reviewed CT ABDOMEN PELVIS W CONTRAST  Result Date: 09/13/2019 CLINICAL DATA:  49 year old male with abdominal distension. Scrotal abscess. EXAM: CT ABDOMEN AND PELVIS WITH CONTRAST TECHNIQUE: Multidetector CT imaging of the abdomen and pelvis was performed using the standard protocol following bolus administration of intravenous contrast. CONTRAST:  168mL OMNIPAQUE IOHEXOL 300 MG/ML  SOLN COMPARISON:  CT abdomen pelvis dated 11/24/2017. FINDINGS: Lower chest: The visualized lung bases are clear. No intra-abdominal free air or free fluid. Hepatobiliary: No focal liver abnormality is seen. No gallstones, gallbladder wall thickening, or biliary dilatation. Pancreas: Unremarkable. No pancreatic ductal dilatation or surrounding inflammatory changes. Spleen: Normal in size without focal abnormality. Adrenals/Urinary Tract: The adrenal glands are unremarkable. There is no hydronephrosis on either side. There is mild right perinephric haziness with faint areas of heterogeneous enhancement in the upper right renal parenchyma concerning for pyelonephritis. Correlation with urinalysis recommended. No drainable fluid collection or abscess. Indeterminate 1 cm hypodense  lesion in the inferior pole of the right kidney similar to prior CT. This can be better evaluated with MRI as suggested on the ultrasound of 11/24/2017. The visualized ureters and urinary bladder are grossly unremarkable. Stomach/Bowel: There is no bowel obstruction or active inflammation. The appendix is normal. Vascular/Lymphatic: Mild aortoiliac atherosclerotic disease. The IVC is unremarkable. No portal venous gas. There is no adenopathy. Reproductive: The prostate and seminal vesicles are grossly unremarkable. There is diffuse scrotal wall thickening and edema. There is a 3.6 x 3.7 cm fluid  collection in the right scrotal wall most consistent with an abscess. This can be better evaluated with ultrasound. Other: There is diffuse lobulated thickening of the subcutaneous soft tissues of the gluteal region with small pockets of air as seen on the prior CT. This is of indeterminate etiology and may be related to liposuction but concerning for a superimposed infection. Clinical correlation is recommended. No drainable fluid collection identified. Musculoskeletal: No acute or significant osseous findings. IMPRESSION: 1. Diffuse scrotal wall thickening and edema with a 3.6 x 3.7 cm right scrotal wall abscess. This can be better evaluated with ultrasound. 2. Diffuse lobulated thickening of the subcutaneous soft tissues of the gluteal region with small pockets of air as seen on the prior CT. This is of indeterminate etiology and may be related to liposuction but concerning for a superimposed infection. Clinical correlation is recommended. No drainable fluid collection identified. 3. Findings concerning for right-sided pyelonephritis. Correlation with urinalysis recommended. No drainable fluid collection or abscess. 4. No bowel obstruction. Normal appendix. 5. Aortic Atherosclerosis (ICD10-I70.0). Electronically Signed   By: Elgie Collard M.D.   On: 09/13/2019 15:55    Impression/Assessment:  49 y.o. male with longstanding hidradenitis suppurativa presents with a right scrotal wall abscess.  No prior history of scrotal abscess.  Recommendation:  -I&D scrotal abscess and due to the large nature recommend in the OR under anesthesia.  The procedure was discussed in detail including potential risks of bleeding, sepsis.  The postoperative care and need for regular dressing changes was reviewed.  He indicated all questions were answered and desires to proceed.  -UDS was cocaine positive and will require procedure emergent  09/13/2019, 7:57 PM  Irineo Axon,  MD

## 2019-09-13 NOTE — Anesthesia Preprocedure Evaluation (Addendum)
Anesthesia Evaluation  Patient identified by MRN, date of birth, ID band Patient awake    Reviewed: Allergy & Precautions, H&P , NPO status , Patient's Chart, lab work & pertinent test results, reviewed documented beta blocker date and time   History of Anesthesia Complications Negative for: history of anesthetic complications  Airway Mallampati: II  TM Distance: >3 FB Neck ROM: full  Mouth opening: Limited Mouth Opening  Dental  (+) Dental Advidsory Given, Chipped, Teeth Intact   Pulmonary neg shortness of breath, neg sleep apnea, neg COPD, neg recent URI, Current Smoker,    Pulmonary exam normal breath sounds clear to auscultation       Cardiovascular Exercise Tolerance: Good negative cardio ROS Normal cardiovascular exam Rhythm:regular Rate:Normal     Neuro/Psych negative neurological ROS  negative psych ROS   GI/Hepatic negative GI ROS, (+)     substance abuse  alcohol use and cocaine use,   Endo/Other  negative endocrine ROS  Renal/GU negative Renal ROS  negative genitourinary   Musculoskeletal   Abdominal   Peds  Hematology negative hematology ROS (+)   Anesthesia Other Findings Past Medical History: No date: Hay fever No date: Hidradenitis suppurativa  Patient admits to using cocaine within the last 3 days. I discussed with him the risk of cocaine use and anesthesia including coronary vasospasm, MI, stroke, and even death.  Patient is aware of the risks and surgeon has declared this an emergency, so we will proceed at this time.  Reproductive/Obstetrics negative OB ROS                            Anesthesia Physical Anesthesia Plan  ASA: II and emergent  Anesthesia Plan: General   Post-op Pain Management:    Induction: Intravenous  PONV Risk Score and Plan: 1 and Ondansetron, Dexamethasone, Midazolam, Promethazine and Treatment may vary due to age or medical  condition  Airway Management Planned: Oral ETT and LMA  Additional Equipment:   Intra-op Plan:   Post-operative Plan: Extubation in OR  Informed Consent: I have reviewed the patients History and Physical, chart, labs and discussed the procedure including the risks, benefits and alternatives for the proposed anesthesia with the patient or authorized representative who has indicated his/her understanding and acceptance.     Dental Advisory Given  Plan Discussed with: Anesthesiologist, CRNA and Surgeon  Anesthesia Plan Comments:         Anesthesia Quick Evaluation

## 2019-09-13 NOTE — H&P (Signed)
History and Physical        Hospital Admission Note Date: 09/13/2019  Patient name: Benjamin Owens Medical record number: [id number] Date of birth: 1971/03/20 Age: 49 y.o. Gender: male  PCP: Galen Manila, NP (Inactive)    Patient coming from: Home  I have reviewed all records in the [facility].    Chief Complaint:  Scrotal abscess for last 1 week, worse in the last 3 days  HPI: Patient is a 49 year old male with history of nicotine use, alcohol dependency, severe hidradenitis suppurativa on Remicade infusion every 5 weeks, presented with worsening scrotal abscess in the last 1 week.  History was obtained from the patient who reported that he typically has boils and abscesses from at bedtime in his groins about them but never had any scrotal abscess before.  He noticed a small boil a week ago on his right scrotal area which continued to get worse and in the last 3 days increased much in size with induration and tenderness.  Patient reported that he had sharp and constant pain 10 out of 10 intensity.  No fevers or chills, nausea or vomiting.   ED work-up/course In ED, temp 99.0 F, respiratory rate 17, pulse 88, BP 115/94 Sodium 134, potassium 3.3, WBCs 15.4, hemoglobin 12.5, hematocrit 36.5, lactic acid 1.5 UA negative for UTI  CT abdomen pelvis showed diffuse scrotal wall thickening and edema 3.6 x3.7 centimeters right scrotal wall abscess, diffuse lobulated thickening of the subcutaneous soft tissues of the gluteal region with small pockets of air is seen on the prior CT, indeterminate etiology, findings concerning for right-sided pyelonephritis  Review of Systems: Positives marked in 'bold' Constitutional: Denies fever, chills, diaphoresis, poor appetite and fatigue.  HEENT: Denies photophobia, eye pain, redness, hearing loss, ear pain, congestion, sore  throat, rhinorrhea, sneezing, mouth sores, trouble swallowing, neck pain, neck stiffness and tinnitus.   Respiratory: Denies SOB, DOE, cough, chest tightness,  and wheezing.   Cardiovascular: Denies chest pain, palpitations and leg swelling.  Gastrointestinal: Denies nausea, vomiting, abdominal pain, diarrhea, constipation, blood in stool and abdominal distention.  Genitourinary: Please see HPI Musculoskeletal: Denies myalgias, back pain, joint swelling, arthralgias and gait problem.  Skin: Denies pallor, rash and wound.  Neurological: Denies dizziness, seizures, syncope, weakness, light-headedness, numbness and headaches.  Hematological: Denies adenopathy. Easy bruising, personal or family bleeding history  Psychiatric/Behavioral: Denies suicidal ideation, mood changes, confusion, nervousness, sleep disturbance and agitation  Past Medical History: Past Medical History:  Diagnosis Date  . Hay fever   . Hidradenitis suppurativa     Past Surgical History:  Procedure Laterality Date  . NO PAST SURGERIES      Medications: Prior to Admission medications   Medication Sig Start Date End Date Taking? Authorizing Provider  calcium carbonate (OS-CAL) 600 MG TABS tablet Take 1 tablet (600 mg total) by mouth 2 (two) times daily with a meal for 7 days. 04/20/18 04/27/18  Dionne Bucy, MD  clindamycin (CLEOCIN) 300 MG capsule Take 300 mg by mouth 2 (two) times daily.  11/25/17   [provider]  gabapentin (NEURONTIN) 300 MG capsule Take 300 mg by mouth as needed.  10/07/17   [provider]  inFLIXimab (REMICADE) 100 MG injection Inject into the vein.    [provider]  potassium chloride (K-DUR) 10 MEQ tablet Take 1 tablet (10 mEq total) by mouth 2 (two) times daily for 7 days. 04/20/18 04/27/18  Arta Silence, MD  predniSONE (DELTASONE) 10 MG tablet Day 1-2 take 6 pills. Day 3 take 5 pills then reduce by 1 pill each day. 04/26/18   Mikey College, NP     Allergies:   Allergies  Allergen Reactions  . Vancomycin Itching    Itching at IV site and redness (pt tolerated with IV benadryl)  . Azithromycin Itching    Pt arm itching after receiving IV Zithromax    Social History:  reports that he has been smoking cigarettes. He has been smoking about 0.25 packs per day. He has never used smokeless tobacco. He reports current alcohol use of about 3.0 standard drinks of alcohol per week. He reports current drug use. Drug: Marijuana.  Family History: Family History  Problem Relation Age of Onset  . Stroke Mother   . Heart disease Mother        Degenerative heart disease  . Diabetes Mother   . Throat cancer Father   . Lung cancer Maternal Aunt   . Breast cancer Paternal Aunt   . Lung cancer Paternal Uncle        smoker  . Healthy Maternal Grandmother   . Heart attack Maternal Grandfather   . Breast cancer Paternal Grandmother   . Heart attack Paternal Grandfather   . Healthy Cousin   . Prostate cancer Paternal Uncle   . Breast cancer Maternal Aunt     Physical Exam: Blood pressure 112/73, pulse 61, temperature 99 F (37.2 C), temperature source Oral, resp. rate 18, height 6' (1.829 m), weight 79.4 kg, SpO2 95 %. General: Alert, awake, oriented x3, in no acute distress. Eyes: pink conjunctiva,anicteric sclera, pupils equal and reactive to light and accomodation, HEENT: normocephalic, atraumatic, oropharynx clear Neck: supple, no masses or lymphadenopathy, no goiter, no bruits, no JVD CVS: Regular rate and rhythm, without murmurs, rubs or gallops. No lower extremity edema Resp : Clear to auscultation bilaterally, no wheezing, rales or rhonchi. GI : Soft, nontender, nondistended, positive bowel sounds, no masses. No hepatomegaly. No hernia.  Musculoskeletal: No clubbing or cyanosis, positive pedal pulses. No contracture. ROM intact  Neuro: Grossly intact, no focal neurological deficits, strength 5/5 upper and lower extremities  bilaterally Psych: alert and oriented x 3, normal mood and affect Skin/GU: Right-sided scrotal abscess, indurated, tender, ~4cm   LABS on Admission: I have personally reviewed all the labs and imagings below    Basic Metabolic Panel: Recent Labs  Lab 09/13/19 1233  NA 134*  K 3.3*  CL 102  CO2 24  GLUCOSE 140*  BUN 8  CREATININE 0.92  CALCIUM 8.3*   Liver Function Tests: Recent Labs  Lab 09/13/19 1233  AST 22  ALT 14  ALKPHOS 201*  BILITOT 1.0  PROT 9.3*  ALBUMIN 2.6*   No results for input(s): LIPASE, AMYLASE in the last 168 hours. No results for input(s): AMMONIA in the last 168 hours. CBC: Recent Labs  Lab 09/13/19 1233  WBC 15.4*  NEUTROABS 10.7*  HGB 12.5*  HCT 36.5*  MCV 77.7*  PLT 333   Cardiac Enzymes: No results for input(s): CKTOTAL, CKMB, CKMBINDEX, TROPONINI in the last 168 hours. BNP: Invalid input(s): POCBNP CBG: No results for input(s): GLUCAP in the last 168 hours.  Radiological  Exams on Admission:  CT ABDOMEN PELVIS W CONTRAST  Result Date: 09/13/2019 CLINICAL DATA:  49 year old male with abdominal distension. Scrotal abscess. EXAM: CT ABDOMEN AND PELVIS WITH CONTRAST TECHNIQUE: Multidetector CT imaging of the abdomen and pelvis was performed using the standard protocol following bolus administration of intravenous contrast. CONTRAST:  OMNIPAQUE IOHEXOL 300 MG/ML  SOLN COMPARISON:  CT abdomen pelvis dated 11/24/2017. FINDINGS: Lower chest: The visualized lung bases are clear. No intra-abdominal free air or free fluid. Hepatobiliary: No focal liver abnormality is seen. No gallstones, gallbladder wall thickening, or biliary dilatation. Pancreas: Unremarkable. No pancreatic ductal dilatation or surrounding inflammatory changes. Spleen: Normal in size without focal abnormality. Adrenals/Urinary Tract: The adrenal glands are unremarkable. There is no hydronephrosis on either side. There is mild right perinephric haziness with faint areas of  heterogeneous enhancement in the upper right renal parenchyma concerning for pyelonephritis. Correlation with urinalysis recommended. No drainable fluid collection or abscess. Indeterminate 1 cm hypodense lesion in the inferior pole of the right kidney similar to prior CT. This can be better evaluated with MRI as suggested on the ultrasound of 11/24/2017. The visualized ureters and urinary bladder are grossly unremarkable. Stomach/Bowel: There is no bowel obstruction or active inflammation. The appendix is normal. Vascular/Lymphatic: Mild aortoiliac atherosclerotic disease. The IVC is unremarkable. No portal venous gas. There is no adenopathy. Reproductive: The prostate and seminal vesicles are grossly unremarkable. There is diffuse scrotal wall thickening and edema. There is a 3.6 x 3.7 cm fluid collection in the right scrotal wall most consistent with an abscess. This can be better evaluated with ultrasound. Other: There is diffuse lobulated thickening of the subcutaneous soft tissues of the gluteal region with small pockets of air as seen on the prior CT. This is of indeterminate etiology and may be related to liposuction but concerning for a superimposed infection. Clinical correlation is recommended. No drainable fluid collection identified. Musculoskeletal: No acute or significant osseous findings. IMPRESSION: 1. Diffuse scrotal wall thickening and edema with a 3.6 x 3.7 cm right scrotal wall abscess. This can be better evaluated with ultrasound. 2. Diffuse lobulated thickening of the subcutaneous soft tissues of the gluteal region with small pockets of air as seen on the prior CT. This is of indeterminate etiology and may be related to liposuction but concerning for a superimposed infection. Clinical correlation is recommended. No drainable fluid collection identified. 3. Findings concerning for right-sided pyelonephritis. Correlation with urinalysis recommended. No drainable fluid collection or abscess. 4.  No bowel obstruction. Normal appendix. 5. Aortic Atherosclerosis (ICD10-I70.0). Electronically Signed   By: Elgie Collard M.D.   On: 09/13/2019 15:55      EKG: Independently reviewed.  None available   Assessment/Plan Principal Problem:   Abscess of scrotal wall in the setting of severe hidradenitis suppurativa  -Per patient, he has used clindamycin but feels it is not helping anymore, receives Remicade infusion every 5 weeks. -Will place on IV Ancef, IVF -Continue n.p.o. status, Per EDP, urology has been consulted and possible OR tonight, awaiting Covid testing -Continue pain control -UA negative for UTI  Active Problems:    Nicotine dependence, uncomplicated -Patient ports that he smokes half pack per day, declined nicotine patch -Counseled on smoking cessation    Alcohol dependence (HCC) -Per patient he drinks sixpack beer every day and on the weekend more than that.  - He has gone into alcohol withdrawals in the past, remains at risk for alcohol withdrawals. - will place on CIWA with Ativan, thiamine, folate, MVI  Hypokalemia Replaced IV  DVT prophylaxis: SCDs for now, plan for or tonight.  Placed on chemical DVT prophylaxis after the surgery.  CODE STATUS: Full CODE STATUS, addressed with the patient  Consults called: Urology called by EDP  Family Communication: Admission, patients condition and plan of care including tests being ordered have been discussed with the patient who indicates understanding and agree with the plan and Code Status  Admission status:   The medical decision making on this patient was of high complexity and the patient is at high risk for clinical deterioration, therefore this is a level 3 admission.  Severity of Illness:      The appropriate patient status for this patient is INPATIENT. Inpatient status is judged to be reasonable and necessary in order to provide the required intensity of service to ensure the patient's safety. The  patient's presenting symptoms, physical exam findings, and initial radiographic and laboratory data in the context of their chronic comorbidities is felt to place them at high risk for further clinical deterioration. Furthermore, it is not anticipated that the patient will be medically stable for discharge from the hospital within 2 midnights of admission. The following factors support the patient status of inpatient.   " The patient's presenting symptoms include scrotal abscess " The worrisome physical exam findings include large scrotal abscess, indurated " The initial radiographic and laboratory data are worrisome because of leukocytosis, CT with scrotal abscess, " The chronic co-morbidities include history of hidradenitis suppurativa   * I certify that at the point of admission it is my clinical judgment that the patient will require inpatient hospital care spanning beyond 2 midnights from the point of admission due to high intensity of service, high risk for further deterioration and high frequency of surveillance required.*    Time Spent on Admission:      Rudolph Daoust M.D. [facility] 09/13/2019, 4:07 PM

## 2019-09-13 NOTE — ED Notes (Signed)
Pt transported to CT ?

## 2019-09-13 NOTE — ED Triage Notes (Signed)
Pt c/o having an abscess to the scrotum for the past 3 days with a hx of the same.

## 2019-09-14 ENCOUNTER — Encounter: Payer: Self-pay | Admitting: Urology

## 2019-09-14 LAB — CBC
HCT: 34.9 % — ABNORMAL LOW (ref 39.0–52.0)
Hemoglobin: 11.3 g/dL — ABNORMAL LOW (ref 13.0–17.0)
MCH: 26.3 pg (ref 26.0–34.0)
MCHC: 32.4 g/dL (ref 30.0–36.0)
MCV: 81.4 fL (ref 80.0–100.0)
Platelets: 314 10*3/uL (ref 150–400)
RBC: 4.29 MIL/uL (ref 4.22–5.81)
RDW: 16.3 % — ABNORMAL HIGH (ref 11.5–15.5)
WBC: 15.9 10*3/uL — ABNORMAL HIGH (ref 4.0–10.5)
nRBC: 0 % (ref 0.0–0.2)

## 2019-09-14 LAB — HIV ANTIBODY (ROUTINE TESTING W REFLEX): HIV Screen 4th Generation wRfx: NONREACTIVE

## 2019-09-14 LAB — BASIC METABOLIC PANEL
Anion gap: 8 (ref 5–15)
BUN: 7 mg/dL (ref 6–20)
CO2: 26 mmol/L (ref 22–32)
Calcium: 7.8 mg/dL — ABNORMAL LOW (ref 8.9–10.3)
Chloride: 101 mmol/L (ref 98–111)
Creatinine, Ser: 0.81 mg/dL (ref 0.61–1.24)
GFR calc Af Amer: >60 mL/min (ref >60)
GFR calc non Af Amer: >60 mL/min (ref >60)
Glucose, Bld: 88 mg/dL (ref 70–99)
Potassium: 3.2 mmol/L — ABNORMAL LOW (ref 3.5–5.1)
Sodium: 135 mmol/L (ref 135–145)

## 2019-09-14 MED ORDER — FENTANYL CITRATE (PF) 100 MCG/2ML IJ SOLN
INTRAMUSCULAR | Status: AC
  Filled 2019-09-14: qty 2

## 2019-09-14 MED ORDER — BUPIVACAINE HCL 0.5 % IJ SOLN
INTRAMUSCULAR | Status: DC | PRN
  Administered 2019-09-14: 4 mL

## 2019-09-14 MED ORDER — AMOXICILLIN-POT CLAVULANATE 875-125 MG PO TABS
1.0000 | ORAL_TABLET | Freq: Two times a day (BID) | ORAL | Status: DC
  Administered 2019-09-15: 1 via ORAL
  Filled 2019-09-14: qty 1

## 2019-09-14 MED ORDER — PROMETHAZINE HCL 25 MG/ML IJ SOLN: 6.2500 mg | INTRAMUSCULAR | Status: DC | PRN

## 2019-09-14 MED ORDER — SODIUM CHLORIDE 0.9 % IV SOLN
3.0000 g | Freq: Four times a day (QID) | INTRAVENOUS | Status: AC
  Administered 2019-09-14 – 2019-09-15 (×3): 3 g via INTRAVENOUS
  Filled 2019-09-14 (×2): qty 8
  Filled 2019-09-14: qty 3
  Filled 2019-09-14: qty 8

## 2019-09-14 MED ORDER — POTASSIUM CHLORIDE CRYS ER 20 MEQ PO TBCR
40.0000 meq | EXTENDED_RELEASE_TABLET | Freq: Once | ORAL | Status: AC
  Administered 2019-09-14: 40 meq via ORAL
  Filled 2019-09-14: qty 2

## 2019-09-14 MED ORDER — OXYCODONE-ACETAMINOPHEN 5-325 MG PO TABS
1.0000 | ORAL_TABLET | Freq: Four times a day (QID) | ORAL | Status: DC | PRN
  Administered 2019-09-14 (×2): 1 via ORAL
  Filled 2019-09-14 (×2): qty 1

## 2019-09-14 MED ORDER — ONDANSETRON HCL 4 MG/2ML IJ SOLN
INTRAMUSCULAR | Status: DC | PRN
  Administered 2019-09-14: 4 mg via INTRAVENOUS

## 2019-09-14 MED ORDER — PROPOFOL 10 MG/ML IV BOLUS
INTRAVENOUS | Status: AC
  Filled 2019-09-14: qty 20

## 2019-09-14 MED ORDER — FENTANYL CITRATE (PF) 100 MCG/2ML IJ SOLN: 25.0000 ug | INTRAMUSCULAR | Status: DC | PRN

## 2019-09-14 MED ORDER — ENOXAPARIN SODIUM 40 MG/0.4ML ~~LOC~~ SOLN
40.0000 mg | SUBCUTANEOUS | Status: DC
  Administered 2019-09-14: 40 mg via SUBCUTANEOUS
  Filled 2019-09-14: qty 0.4

## 2019-09-14 NOTE — Progress Notes (Signed)
[facility]  - [place] [facility]   PATIENT NAME: Benjamin Owens    MR#:  [id number]  DATE OF BIRTH:  03-19-1971  SUBJECTIVE:  denies any complaints. Has mild pain right scrotal area.  REVIEW OF SYSTEMS:   Review of Systems  Constitutional: Negative for chills, fever and weight loss.  HENT: Negative for ear discharge, ear pain and nosebleeds.   Eyes: Negative for blurred vision, pain and discharge.  Respiratory: Negative for sputum production, shortness of breath, wheezing and stridor.   Cardiovascular: Negative for chest pain, palpitations, orthopnea and PND.  Gastrointestinal: Negative for abdominal pain, diarrhea, nausea and vomiting.  Genitourinary: Negative for frequency and urgency.  Musculoskeletal: Negative for back pain and joint pain.  Neurological: Positive for weakness. Negative for sensory change, speech change and focal weakness.  Psychiatric/Behavioral: Negative for depression and hallucinations. The patient is not nervous/anxious.    Tolerating Diet:yes Tolerating PT: ambulatory  DRUG ALLERGIES:   Allergies  Allergen Reactions  . Vancomycin Itching    Itching at IV site and redness (pt tolerated with IV benadryl)  . Azithromycin Itching    Pt arm itching after receiving IV Zithromax    VITALS:  Blood pressure 119/85, pulse 68, temperature 98.6 F (37 C), resp. rate 14, height 6' (1.829 m), weight 79.4 kg, SpO2 99 %.  PHYSICAL EXAMINATION:   Physical Exam  GENERAL:  49 y.o.-year-old patient lying in the bed with no acute distress.  EYES: Pupils equal, round, reactive to light and accommodation. No scleral icterus.   HEENT: Head atraumatic, normocephalic. Oropharynx and nasopharynx clear.  NECK:  Supple, no jugular venous distention. No thyroid enlargement, no tenderness.  LUNGS: Normal breath sounds bilaterally, no wheezing, rales, rhonchi. No use of accessory muscles of respiration.  CARDIOVASCULAR: S1, S2 normal. No murmurs, rubs,  or gallops.  ABDOMEN: Soft, nontender, nondistended. Bowel sounds present. No organomegaly or mass.  Local dressing present over the right scrotal area. EXTREMITIES: No cyanosis, clubbing or edema b/l.    NEUROLOGIC: Cranial nerves II through XII are intact. No focal Motor or sensory deficits b/l.   PSYCHIATRIC:  patient is alert and oriented x 3.  SKIN: No obvious rash, lesion, or ulcer.   LABORATORY PANEL:  CBC Recent Labs  Lab 09/14/19 0432  WBC 15.9*  HGB 11.3*  HCT 34.9*  PLT 314    Chemistries  Recent Labs  Lab 09/13/19 1233 09/13/19 1233 09/13/19 2020 09/14/19 0432  NA 134*   < >  --  135  K 3.3*   < >  --  3.2*  CL 102   < >  --  101  CO2 24   < >  --  26  GLUCOSE 140*   < >  --  88  BUN 8   < >  --  7  CREATININE 0.92   < >  --  0.81  CALCIUM 8.3*   < >  --  7.8*  MG  --   --  1.9  --   AST 22  --   --   --   ALT 14  --   --   --   ALKPHOS 201*  --   --   --   BILITOT 1.0  --   --   --    < > = values in this interval not displayed.   Cardiac Enzymes No results for input(s): TROPONINI in the last 168 hours. RADIOLOGY:  CT ABDOMEN PELVIS W CONTRAST  Result Date: 09/13/2019 CLINICAL DATA:  49 year old male with abdominal distension. Scrotal abscess. EXAM: CT ABDOMEN AND PELVIS WITH CONTRAST TECHNIQUE: Multidetector CT imaging of the abdomen and pelvis was performed using the standard protocol following bolus administration of intravenous contrast. CONTRAST:  OMNIPAQUE IOHEXOL 300 MG/ML  SOLN COMPARISON:  CT abdomen pelvis dated 11/24/2017. FINDINGS: Lower chest: The visualized lung bases are clear. No intra-abdominal free air or free fluid. Hepatobiliary: No focal liver abnormality is seen. No gallstones, gallbladder wall thickening, or biliary dilatation. Pancreas: Unremarkable. No pancreatic ductal dilatation or surrounding inflammatory changes. Spleen: Normal in size without focal abnormality. Adrenals/Urinary Tract: The adrenal glands are unremarkable.  There is no hydronephrosis on either side. There is mild right perinephric haziness with faint areas of heterogeneous enhancement in the upper right renal parenchyma concerning for pyelonephritis. Correlation with urinalysis recommended. No drainable fluid collection or abscess. Indeterminate 1 cm hypodense lesion in the inferior pole of the right kidney similar to prior CT. This can be better evaluated with MRI as suggested on the ultrasound of 11/24/2017. The visualized ureters and urinary bladder are grossly unremarkable. Stomach/Bowel: There is no bowel obstruction or active inflammation. The appendix is normal. Vascular/Lymphatic: Mild aortoiliac atherosclerotic disease. The IVC is unremarkable. No portal venous gas. There is no adenopathy. Reproductive: The prostate and seminal vesicles are grossly unremarkable. There is diffuse scrotal wall thickening and edema. There is a 3.6 x 3.7 cm fluid collection in the right scrotal wall most consistent with an abscess. This can be better evaluated with ultrasound. Other: There is diffuse lobulated thickening of the subcutaneous soft tissues of the gluteal region with small pockets of air as seen on the prior CT. This is of indeterminate etiology and may be related to liposuction but concerning for a superimposed infection. Clinical correlation is recommended. No drainable fluid collection identified. Musculoskeletal: No acute or significant osseous findings. IMPRESSION: 1. Diffuse scrotal wall thickening and edema with a 3.6 x 3.7 cm right scrotal wall abscess. This can be better evaluated with ultrasound. 2. Diffuse lobulated thickening of the subcutaneous soft tissues of the gluteal region with small pockets of air as seen on the prior CT. This is of indeterminate etiology and may be related to liposuction but concerning for a superimposed infection. Clinical correlation is recommended. No drainable fluid collection identified. 3. Findings concerning for right-sided  pyelonephritis. Correlation with urinalysis recommended. No drainable fluid collection or abscess. 4. No bowel obstruction. Normal appendix. 5. Aortic Atherosclerosis (ICD10-I70.0). Electronically Signed   By: Elgie Collard M.D.   On: 09/13/2019 15:55   ASSESSMENT AND PLAN:  49 year old male with history of nicotine use, alcohol dependency, severe hidradenitis suppurativa on Remicade infusion every 5 weeks, presented with worsening scrotal abscess in the last 1 week.  Abscess of scrotal wall in the setting of known h/osevere hidradenitis suppurativa  -Per patient he receives Remicade infusion every 5 weeks. - on IV Ancef--change to po abxs depending on wound cx -WC--coccobacillus --Continue pain control -UA negative for UTI -patient is status post incision and drainage of scrotal abscess by Dr. Lonna Cobb. -Dressing instructions per urology    Nicotine dependence, uncomplicated -Patient ports that he smokes half pack per day, declined nicotine patch -Counseled on smoking cessation  polysubstance drug abuse and   Alcohol dependence (HCC) -Per patient he drinks sixpack beer every day and on the weekend more than that.  - He has gone into alcohol withdrawals in the past, remains at risk for alcohol withdrawals. -  on CIWA  with Ativan, thiamine, folate, MVI -urine drug screen positive for cocaine and marijuana  Hypokalemia Replaced IV  DVT prophylaxis: Lovenox  Procedures: incision and drainage of scrotal abscess Family communication : none Consults : urology CODE STATUS: full DVT Prophylaxis : Lovenox  Status is: Inpatient  Remains inpatient appropriate because:IV treatments appropriate due to intensity of illness or inability to take PO   Dispo: The patient is from: Home              Anticipated d/c is to: Home              Anticipated d/c date is: 1 day              Patient currently is not medically stable to d/c.  Patient will be reevaluated by urology with dressing  change on 624. If remains stable Discharged to PO antibiotics once culture sensitivity results are back.      TOTAL TIME TAKING CARE OF THIS PATIENT: *35* minutes.  >50% time spent on counselling and coordination of care  Note: This dictation was prepared with Dragon dictation along with smaller phrase technology. Any transcriptional errors that result from this process are unintentional.  Fritzi Mandes M.D    [facility]   CC: Primary care physician; Mikey College, NP (Inactive)Patient ID: Benjamin Owens, male   DOB: 28-Aug-1970, 49 y.o.   MRN: [id number]

## 2019-09-14 NOTE — Anesthesia Procedure Notes (Signed)
Procedure Name: Intubation Performed by: Daryan Buell, CRNA Pre-anesthesia Checklist: Patient identified, Patient being monitored, Timeout performed, Emergency Drugs available and Suction available Patient Re-evaluated:Patient Re-evaluated prior to induction Oxygen Delivery Method: Circle system utilized Preoxygenation: Pre-oxygenation with 100% oxygen Induction Type: IV induction Ventilation: Mask ventilation without difficulty Laryngoscope Size: 3 and McGraph Grade View: Grade I Tube type: Oral Tube size: 7.5 mm Number of attempts: 1 Airway Equipment and Method: Stylet Placement Confirmation: ETT inserted through vocal cords under direct vision,  positive ETCO2 and breath sounds checked- equal and bilateral Secured at: 21 cm Tube secured with: Tape Dental Injury: Teeth and Oropharynx as per pre-operative assessment        

## 2019-09-14 NOTE — Transfer of Care (Signed)
Immediate Anesthesia Transfer of Care Note  Patient: Benjamin Owens  Procedure(s) Performed: INCISION AND DRAINAGE ABSCESS (Right Groin)  Patient Location: PACU  Anesthesia Type:General  Level of Consciousness: awake and patient cooperative  Airway & Oxygen Therapy: Patient Spontanous Breathing and Patient connected to face mask oxygen  Post-op Assessment: Report given to RN and Post -op Vital signs reviewed and stable  Post vital signs: Reviewed and stable  Last Vitals:  Vitals Value Taken Time  BP 111/69 09/14/19 0032  Temp    Pulse 83 09/14/19 0034  Resp 10 09/14/19 0034  SpO2 100 % 09/14/19 0034  Vitals shown include unvalidated device data.  Last Pain:  Vitals:   09/13/19 2106  TempSrc:   PainSc: Asleep         Complications: No complications documented.

## 2019-09-14 NOTE — Op Note (Signed)
Preoperative diagnosis:  1. Right scrotal wall abscess  Postoperative diagnosis:  1. Same  Procedure: 1. Incision and drainage of right scrotal abscess  Surgeon: Riki Altes, MD  Anesthesia: General  Complications: None  Intraoperative findings: Anterior scrotal wall abscess right hemiscrotum; large amount of thick pus aspirated  EBL: Minimal  Specimens: Culture swabs abscess  Indication: Benjamin Owens is a 49 y.o. male with chronic hidradenitis suppurativa and a 4-day history of increasing scrotal pain and swelling.  Fluctuant area anterior right hemiscrotum and CT showing a 3.5 cm anterior scrotal wall abscess.  After reviewing the management options for treatment, he elected to proceed with the above surgical procedure(s). We have discussed the potential benefits and risks of the procedure, side effects of the proposed treatment, the likelihood of the patient achieving the goals of the procedure, and any potential problems that might occur during the procedure or recuperation. Informed consent has been obtained.  Description of procedure:  The patient was taken to the operating room and general anesthesia was induced.  The patient was placed in the supine position, prepped and draped in the usual sterile fashion, and preoperative antibiotics were administered. A preoperative time-out was performed.   A longitudinal incision was made over the fluctuant area and >30 cc of thick pus was aspirated.  The incision was extended superiorly to open the entire abscess cavity.  An ellipse of skin was then excised centrally.  Site was copiously irrigated with saline.  Hemostasis was obtained with cautery.  The abscess cavity was packed tightly with 1 inch iodoform gauze.  A dressing of fluffs, ABD and mesh underwear was applied.  After anesthetic reversal he was transported to the PACU in stable condition.   Riki Altes, M.D.

## 2019-09-14 NOTE — Progress Notes (Signed)
Urology Consult Follow Up  Subjective: Patient is a 49 year old male with a history of chronic hydradenitis suppurativa and a 4-day history of increasing scrotal pain and swelling who underwent incision and drainage of a right scrotal abscess last night.  He states his scrotal pain is much improved after undergoing the incision and drainage.    VSS afebrile.  WBC count 15.9.  Serum creatinine 0.81.  Preliminary wound cultures for moderate gram-negative coccobacilli.   Anti-infectives: Anti-infectives (From admission, onward)   Start     Dose/Rate Route Frequency Ordered Stop   09/13/19 2200  ceFAZolin (ANCEF) IVPB 2g/100 mL premix     Discontinue     2 g 200 mL/hr over 30 Minutes Intravenous Every 8 hours 09/13/19 1911     09/13/19 1545  clindamycin (CLEOCIN) IVPB 900 mg        900 mg 100 mL/hr over 30 Minutes Intravenous  Once 09/13/19 1541 09/13/19 1649      Current Facility-Administered Medications  Medication Dose Route Frequency Provider Last Rate Last Admin   acetaminophen (TYLENOL) tablet 650 mg  650 mg Oral Q6H PRN Stoioff, Scott C, MD       Or   acetaminophen (TYLENOL) suppository 650 mg  650 mg Rectal Q6H PRN Stoioff, Scott C, MD       ceFAZolin (ANCEF) IVPB 2g/100 mL premix  2 g Intravenous Q8H Stoioff, Scott C, MD 200 mL/hr at 09/14/19 0517 2 g at 57/32/20 2542   folic acid (FOLVITE) tablet 1 mg  1 mg Oral Daily Stoioff, Scott C, MD   1 mg at 09/14/19 7062   HYDROmorphone (DILAUDID) injection 1 mg  1 mg Intravenous Q4H PRN Stoioff, Scott C, MD   1 mg at 09/14/19 0940   LORazepam (ATIVAN) tablet 1-4 mg  1-4 mg Oral Q1H PRN Stoioff, Ronda Fairly, MD       Or   LORazepam (ATIVAN) injection 1-4 mg  1-4 mg Intravenous Q1H PRN Stoioff, Ronda Fairly, MD       multivitamin with minerals tablet 1 tablet  1 tablet Oral Daily Stoioff, Scott C, MD   1 tablet at 09/14/19 0933   ondansetron (ZOFRAN) tablet 4 mg  4 mg Oral Q6H PRN Stoioff, Scott C, MD       Or   ondansetron (ZOFRAN)  injection 4 mg  4 mg Intravenous Q6H PRN Stoioff, Scott C, MD       oxyCODONE-acetaminophen (PERCOCET/ROXICET) 5-325 MG per tablet 1 tablet  1 tablet Oral Q6H PRN Fritzi Mandes, MD       sodium chloride flush (NS) 0.9 % injection 3 mL  3 mL Intravenous Once Stoioff, Scott C, MD       thiamine tablet 100 mg  100 mg Oral Daily Stoioff, Scott C, MD   100 mg at 09/14/19 3762   Or   thiamine (B-1) injection 100 mg  100 mg Intravenous Daily Stoioff, Scott C, MD         Objective: Vital signs in last 24 hours: Temp:  [97.5 F (36.4 C)-99 F (37.2 C)] 98.1 F (36.7 C) (06/23 0257) Pulse Rate:  [54-88] 69 (06/23 0257) Resp:  [12-18] 16 (06/23 0257) BP: (104-136)/(69-94) 126/73 (06/23 0257) SpO2:  [94 %-100 %] 96 % (06/23 0257) Weight:  [79.4 kg] 79.4 kg (06/22 1230)  Intake/Output from previous day: 06/22 0701 - 06/23 0700 In: 335 [I.V.:235; IV Piggyback:100] Out: -  Intake/Output this shift: Total I/O In: -  Out: 200 [Urine:200]   Physical Exam  Constitutional:  Well nourished. Alert and oriented, No acute distress. HEENT: [place] AT, moist mucus membranes.  Trachea midline, no masses. Cardiovascular: No clubbing, cyanosis, or edema. Respiratory: Normal respiratory effort, no increased work of breathing. GI: Abdomen is soft, non tender, non distended, no abdominal masses.  GU:  An incision 5 cm x 3 cm packed with iodoform guaze, with serosanguineous drainage on fluffs and ABD dry.  Granulation tissue is seen on the top edges of the incision.  There is mild tenderness when palpating the right hemiscrotum.  No erythema or crepitus is noted.  Three areas of hidradenitis suppurativa are noted in the right groin area, but they do not appear to be actively infected Neurologic: Grossly intact, no focal deficits, moving all 4 extremities. Psychiatric: Normal mood and affect.  Lab Results:  Recent Labs    09/13/19 1233 09/14/19 0432  WBC 15.4* 15.9*  HGB 12.5* 11.3*  HCT 36.5* 34.9*  PLT  333 314   BMET Recent Labs    09/13/19 1233 09/14/19 0432  NA 134* 135  K 3.3* 3.2*  CL 102 101  CO2 24 26  GLUCOSE 140* 88  BUN 8 7  CREATININE 0.92 0.81  CALCIUM 8.3* 7.8*   PT/INR No results for input(s): LABPROT, INR in the last 72 hours. ABG No results for input(s): PHART, HCO3 in the last 72 hours.  Invalid input(s): PCO2, PO2  Studies/Results: CT ABDOMEN PELVIS W CONTRAST  Result Date: 09/13/2019 CLINICAL DATA:  49 year old male with abdominal distension. Scrotal abscess. EXAM: CT ABDOMEN AND PELVIS WITH CONTRAST TECHNIQUE: Multidetector CT imaging of the abdomen and pelvis was performed using the standard protocol following bolus administration of intravenous contrast. CONTRAST:  OMNIPAQUE IOHEXOL 300 MG/ML  SOLN COMPARISON:  CT abdomen pelvis dated 11/24/2017. FINDINGS: Lower chest: The visualized lung bases are clear. No intra-abdominal free air or free fluid. Hepatobiliary: No focal liver abnormality is seen. No gallstones, gallbladder wall thickening, or biliary dilatation. Pancreas: Unremarkable. No pancreatic ductal dilatation or surrounding inflammatory changes. Spleen: Normal in size without focal abnormality. Adrenals/Urinary Tract: The adrenal glands are unremarkable. There is no hydronephrosis on either side. There is mild right perinephric haziness with faint areas of heterogeneous enhancement in the upper right renal parenchyma concerning for pyelonephritis. Correlation with urinalysis recommended. No drainable fluid collection or abscess. Indeterminate 1 cm hypodense lesion in the inferior pole of the right kidney similar to prior CT. This can be better evaluated with MRI as suggested on the ultrasound of 11/24/2017. The visualized ureters and urinary bladder are grossly unremarkable. Stomach/Bowel: There is no bowel obstruction or active inflammation. The appendix is normal. Vascular/Lymphatic: Mild aortoiliac atherosclerotic disease. The IVC is unremarkable. No  portal venous gas. There is no adenopathy. Reproductive: The prostate and seminal vesicles are grossly unremarkable. There is diffuse scrotal wall thickening and edema. There is a 3.6 x 3.7 cm fluid collection in the right scrotal wall most consistent with an abscess. This can be better evaluated with ultrasound. Other: There is diffuse lobulated thickening of the subcutaneous soft tissues of the gluteal region with small pockets of air as seen on the prior CT. This is of indeterminate etiology and may be related to liposuction but concerning for a superimposed infection. Clinical correlation is recommended. No drainable fluid collection identified. Musculoskeletal: No acute or significant osseous findings. IMPRESSION: 1. Diffuse scrotal wall thickening and edema with a 3.6 x 3.7 cm right scrotal wall abscess. This can be better evaluated with ultrasound. 2. Diffuse lobulated thickening  of the subcutaneous soft tissues of the gluteal region with small pockets of air as seen on the prior CT. This is of indeterminate etiology and may be related to liposuction but concerning for a superimposed infection. Clinical correlation is recommended. No drainable fluid collection identified. 3. Findings concerning for right-sided pyelonephritis. Correlation with urinalysis recommended. No drainable fluid collection or abscess. 4. No bowel obstruction. Normal appendix. 5. Aortic Atherosclerosis (ICD10-I70.0). Electronically Signed   By: Elgie Collard M.D.   On: 09/13/2019 15:55     Assessment and Plan: 49 year old male with a long history of hidradenitis suppurative who underwent I&D of a right scrotal abscess last evening.  -Continue broad-spectrum antibiotics until wound cultures are final - then narrow to culture appropriate antibiotic x 5 days -Daily packing of the incision with iodoform gauze -Encouraged patient to return to dermatology at [facility] to continue Remicade injections   LOS: 1 day    [facility]  [facility] 09/14/2019

## 2019-09-15 LAB — CBC
HCT: 35.4 % — ABNORMAL LOW (ref 39.0–52.0)
Hemoglobin: 11.8 g/dL — ABNORMAL LOW (ref 13.0–17.0)
MCH: 26.6 pg (ref 26.0–34.0)
MCHC: 33.3 g/dL (ref 30.0–36.0)
MCV: 79.7 fL — ABNORMAL LOW (ref 80.0–100.0)
Platelets: 308 10*3/uL (ref 150–400)
RBC: 4.44 MIL/uL (ref 4.22–5.81)
RDW: 16.1 % — ABNORMAL HIGH (ref 11.5–15.5)
WBC: 10.1 10*3/uL (ref 4.0–10.5)
nRBC: 0 % (ref 0.0–0.2)

## 2019-09-15 LAB — BASIC METABOLIC PANEL
Anion gap: 9 (ref 5–15)
BUN: 6 mg/dL (ref 6–20)
CO2: 26 mmol/L (ref 22–32)
Calcium: 8 mg/dL — ABNORMAL LOW (ref 8.9–10.3)
Chloride: 99 mmol/L (ref 98–111)
Creatinine, Ser: 0.83 mg/dL (ref 0.61–1.24)
GFR calc Af Amer: >60 mL/min (ref >60)
GFR calc non Af Amer: >60 mL/min (ref >60)
Glucose, Bld: 83 mg/dL (ref 70–99)
Potassium: 3.3 mmol/L — ABNORMAL LOW (ref 3.5–5.1)
Sodium: 134 mmol/L — ABNORMAL LOW (ref 135–145)

## 2019-09-15 MED ORDER — POTASSIUM CHLORIDE CRYS ER 20 MEQ PO TBCR
40.0000 meq | EXTENDED_RELEASE_TABLET | Freq: Once | ORAL | Status: AC
  Administered 2019-09-15: 40 meq via ORAL
  Filled 2019-09-15: qty 2

## 2019-09-15 MED ORDER — AMOXICILLIN-POT CLAVULANATE 875-125 MG PO TABS: 1.0000 | ORAL_TABLET | Freq: Two times a day (BID) | ORAL | 0 refills | Status: AC

## 2019-09-15 MED ORDER — OXYCODONE-ACETAMINOPHEN 5-325 MG PO TABS: 1.0000 | ORAL_TABLET | Freq: Four times a day (QID) | ORAL | 0 refills | Status: DC | PRN

## 2019-09-15 MED ORDER — THIAMINE HCL 100 MG PO TABS: 100.0000 mg | ORAL_TABLET | Freq: Every day | ORAL | 0 refills | Status: DC

## 2019-09-15 MED ORDER — FOLIC ACID 1 MG PO TABS: 1.0000 mg | ORAL_TABLET | Freq: Every day | ORAL | 0 refills | Status: DC

## 2019-09-15 NOTE — Progress Notes (Signed)
Urology Inpatient Progress Note  Subjective: Afebrile, VSS.  No acute events overnight. WBC count down today, 10.1.  Hemoglobin stable, 11.8. Abscess cultures with moderate Gram negative coccobacilli. Patient tolerated dressing change yesterday afternoon well.  He reports stable scrotal discomfort.  Anti-infectives: Anti-infectives (From admission, onward)   Start     Dose/Rate Route Frequency Ordered Stop   09/15/19 1000  amoxicillin-clavulanate (AUGMENTIN) 875-125 MG per tablet 1 tablet     Discontinue     1 tablet Oral Every 12 hours 09/14/19 1706 09/22/19 0959   09/14/19 1800  Ampicillin-Sulbactam (UNASYN) 3 g in sodium chloride 0.9 % 100 mL IVPB        3 g 200 mL/hr over 30 Minutes Intravenous Every 6 hours 09/14/19 1658 09/15/19 0545   09/13/19 2200  ceFAZolin (ANCEF) IVPB 2g/100 mL premix  Status:  Discontinued        2 g 200 mL/hr over 30 Minutes Intravenous Every 8 hours 09/13/19 1911 09/14/19 1658   09/13/19 1545  clindamycin (CLEOCIN) IVPB 900 mg        900 mg 100 mL/hr over 30 Minutes Intravenous  Once 09/13/19 1541 09/13/19 1649      Current Facility-Administered Medications  Medication Dose Route Frequency Provider Last Rate Last Admin  . acetaminophen (TYLENOL) tablet 650 mg  650 mg Oral Q6H PRN Stoioff, Scott C, MD       Or  . acetaminophen (TYLENOL) suppository 650 mg  650 mg Rectal Q6H PRN Stoioff, Scott C, MD      . amoxicillin-clavulanate (AUGMENTIN) 875-125 MG per tablet 1 tablet  1 tablet Oral Q12H Enedina Finner, MD      . enoxaparin (LOVENOX) injection 40 mg  40 mg Subcutaneous Q24H Enedina Finner, MD   40 mg at 09/14/19 2126  . folic acid (FOLVITE) tablet 1 mg  1 mg Oral Daily Stoioff, Scott C, MD   1 mg at 09/14/19 0933  . HYDROmorphone (DILAUDID) injection 1 mg  1 mg Intravenous Q4H PRN Stoioff, Scott C, MD   1 mg at 09/15/19 0846  . LORazepam (ATIVAN) tablet 1-4 mg  1-4 mg Oral Q1H PRN Stoioff, Verna Czech, MD       Or  . LORazepam (ATIVAN) injection 1-4 mg  1-4  mg Intravenous Q1H PRN Stoioff, Scott C, MD      . multivitamin with minerals tablet 1 tablet  1 tablet Oral Daily Stoioff, Verna Czech, MD   1 tablet at 09/14/19 0933  . ondansetron (ZOFRAN) tablet 4 mg  4 mg Oral Q6H PRN Stoioff, Scott C, MD       Or  . ondansetron (ZOFRAN) injection 4 mg  4 mg Intravenous Q6H PRN Stoioff, Scott C, MD      . oxyCODONE-acetaminophen (PERCOCET/ROXICET) 5-325 MG per tablet 1 tablet  1 tablet Oral Q6H PRN Enedina Finner, MD   1 tablet at 09/14/19 2104  . sodium chloride flush (NS) 0.9 % injection 3 mL  3 mL Intravenous Once Stoioff, Scott C, MD      . thiamine tablet 100 mg  100 mg Oral Daily Stoioff, Scott C, MD   100 mg at 09/14/19 0933   Or  . thiamine (B-1) injection 100 mg  100 mg Intravenous Daily Stoioff, Scott C, MD       Objective: Vital signs in last 24 hours: Temp:  [97.4 F (36.3 C)-98.6 F (37 C)] 98.2 F (36.8 C) (06/24 0518) Pulse Rate:  [65-73] 73 (06/24 0518) Resp:  [14-20] 16 (06/24  0518) BP: (117-119)/(85-86) 118/85 (06/24 0518) SpO2:  [98 %-100 %] 100 % (06/24 0518)  Intake/Output from previous day: 06/23 0701 - 06/24 0700 In: 300 [IV Piggyback:300] Out: 2500 [Urine:2500] Intake/Output this shift: No intake/output data recorded.  Physical Exam Vitals and nursing note reviewed.  Constitutional:      General: He is not in acute distress.    Appearance: He is not ill-appearing, toxic-appearing or diaphoretic.  HENT:     Head: Normocephalic and atraumatic.  Pulmonary:     Effort: Pulmonary effort is normal. No respiratory distress.  Genitourinary:    Comments: Scarring and induration noted at the bilateral inguinal creases consistent with prior HS abscesses/tracts. Surgical wound bed visualized with healthy granulation tissue. Skin:    General: Skin is warm and dry.  Neurological:     Mental Status: He is alert and oriented to person, place, and time.  Psychiatric:        Mood and Affect: Mood normal.        Behavior: Behavior  normal.    Lab Results:  Recent Labs    09/14/19 0432 09/15/19 0837  WBC 15.9* 10.1  HGB 11.3* 11.8*  HCT 34.9* 35.4*  PLT 314 308   BMET Recent Labs    09/14/19 0432 09/15/19 0837  NA 135 134*  K 3.2* 3.3*  CL 101 99  CO2 26 26  GLUCOSE 88 83  BUN 7 6  CREATININE 0.81 0.83  CALCIUM 7.8* 8.0*   Assessment & Plan: 49 year old male with a history of hidradenitis suppurativa now POD 2 from right scrotal I&D with Dr. Bernardo Heater.  He is recovering well, no acute concerns.  Leukocytosis resolved, pain stable.  I removed the iodoform packing in place today and transitioned him to 4x4 gauze wet-to-dry dressings.  He will need these daily until the wound completely heals from secondary intention.  I taught the patient how to perform these wound dressings at home.  Wound is easily visible for him, I suspect he will tolerate these well. Patient may resume routing showers at this point, ok for water to run into the wound.  Ok for discharge from a urologic perspective with daily wet-to-dry dressing changes and plans to follow up at [facility] hidradenitis clinic.  Recommend discharge on 5 to 7 days of empiric antibiotics with possible outpatient narrowing per culture results.  Debroah Loop, PA-C 09/15/2019

## 2019-09-15 NOTE — Progress Notes (Signed)
Pt discharged per MD order. IV removed. Discharge instructions reviewed with pt. Pt verbalized understanding of discharge instructions. Pt was educated on dressing change instructions that morning by PA and was given supplies by RN. Pt declined wheelchair and chose to walk outside.

## 2019-09-15 NOTE — Anesthesia Postprocedure Evaluation (Signed)
Anesthesia Post Note  Patient: Benjamin Owens  Procedure(s) Performed: INCISION AND DRAINAGE ABSCESS (Right Groin)  Patient location during evaluation: PACU Anesthesia Type: General Level of consciousness: awake and alert Pain management: pain level controlled Vital Signs Assessment: post-procedure vital signs reviewed and stable Respiratory status: spontaneous breathing, nonlabored ventilation, respiratory function stable and patient connected to nasal cannula oxygen Cardiovascular status: blood pressure returned to baseline and stable Postop Assessment: no apparent nausea or vomiting Anesthetic complications: no   No complications documented.   Last Vitals:  Vitals:   09/15/19 0518 09/15/19 1129  BP: 118/85 102/68  Pulse: 73 66  Resp: 16 16  Temp: 36.8 C 37 C  SpO2: 100% 98%    Last Pain:  Vitals:   09/15/19 1129  TempSrc: Oral  PainSc:                  Lenard Simmer

## 2019-09-15 NOTE — Discharge Summary (Signed)
ATIBA KIMBERLIN IRJ:[id number] DOB: 1970/11/13 DOA: 09/13/2019  PCP: Galen Manila, NP (Inactive)  Admit date: 09/13/2019 Discharge date: 09/15/2019  Admitted From: home Disposition:  home  Recommendations for Outpatient Follow-up:  1. Follow up with PCP in 1 week 2. Please obtain BMP/CBC in one week 3. [facility] hidradenitis clinic next week 4.Dr. Virl Diamond will f/u on wound cultures  Home Health:none    Discharge Condition:Stable CODE STATUS:full  Diet recommendation: Heart Healthy Brief/Interim Summary: Patient is a 49 year old male with history of nicotine use, alcohol dependency, severe hidradenitis suppurativa on Remicade infusion every 5 weeks, presented with worsening scrotal abscess in the last 1 week.  History was obtained from the patient who reported that he typically has boils and abscesses from at bedtime in his groins about them but never had any scrotal abscess before.  He noticed a small boil a week ago on his right scrotal area which continued to get worse and in the last 3 days increased much in size with induration and tenderness.  Abscess of scrotal wall in the setting of known h/osevere hidradenitis suppurativa -Per patient he receives Remicade infusion every 5 weeks. - on IV Ancef--, changed to po abx as below x7 days for outpatient.  Urology Dr. Lonna Cobb will f/u on wound culture and narrow abx as needed -WC--coccobacillus -UA negative for UTI -patient is status post incision and drainage of scrotal abscess by Dr. Lonna Cobb. -Urologist recommendation daily wet-to-dry dressing changes and plans to follow up at [facility] hidradenitis clinic.  Recommend discharge on 5 to 7 days of empiric antibiotics with possible outpatient narrowing per culture results  Nicotine dependence, uncomplicated -Reported that he smokes half pack per day, declined nicotine patch -Counseled on smoking cessation  polysubstance drug abuse and Alcohol dependence (HCC) -Per patient he  drinks sixpack beer every day and on the weekend more than that. -He has gone into alcohol withdrawals in the past, remains at risk for alcohol withdrawals. - on CIWA with Ativan, thiamine, folate, MVI -urine drug screen positive for cocaine and marijuana   Hypokalemia Replaced this am.  Needs to take po outpt k supplement.    Discharge Diagnoses:  Principal Problem:   Abscess of scrotal wall Active Problems:   Hidradenitis suppurativa   Nicotine dependence, uncomplicated   Alcohol dependence [facility])    Discharge Instructions  Discharge Instructions    Call MD for:  severe uncontrolled pain   Complete by: As directed    Call MD for:  temperature >100.4   Complete by: As directed    Change dressing (specify)   Complete by: As directed    Dressing change: 1 times per day using wet to dry   Diet - low sodium heart healthy   Complete by: As directed    Discharge instructions   Complete by: As directed    Follow up with [facility] hydroadenitis clinic F/u with pcp in one week  daily wet-to-dry dressing changes  as instructed F/u with urology   Increase activity slowly   Complete by: As directed      Allergies as of 09/15/2019      Reactions   Vancomycin Itching   Itching at IV site and redness (pt tolerated with IV benadryl)   Azithromycin Itching   Pt arm itching after receiving IV Zithromax      Medication List    STOP taking these medications   clindamycin 300 MG capsule Commonly known as: CLEOCIN     TAKE these medications   amoxicillin-clavulanate 875-125  MG tablet Commonly known as: AUGMENTIN Take 1 tablet by mouth every 12 (twelve) hours for 7 days.   calcium carbonate 600 MG Tabs tablet Commonly known as: OS-CAL Take 1 tablet (600 mg total) by mouth 2 (two) times daily with a meal for 7 days.   folic acid 1 MG tablet Commonly known as: FOLVITE Take 1 tablet (1 mg total) by mouth daily. Start taking on: September 16, 2019   inFLIXimab 100 MG  injection Commonly known as: REMICADE Inject into the vein.   oxyCODONE-acetaminophen 5-325 MG tablet Commonly known as: PERCOCET/ROXICET Take 1 tablet by mouth every 6 (six) hours as needed for moderate pain or severe pain.   potassium chloride 10 MEQ tablet Commonly known as: KLOR-CON Take 1 tablet (10 mEq total) by mouth 2 (two) times daily for 7 days.   thiamine 100 MG tablet Take 1 tablet (100 mg total) by mouth daily. Start taking on: September 16, 2019            Discharge Care Instructions  (From admission, onward)         Start     Ordered   09/15/19 0000  Change dressing (specify)       Comments: Dressing change: 1 times per day using wet to dry   09/15/19 1234          Follow-up Information    Galen ManilaKennedy, Lauren Renee, NP Follow up in 1 week(s).   Specialty: Nurse Practitioner Contact information: [address] [place] [id number] [phone]              Allergies  Allergen Reactions  . Vancomycin Itching    Itching at IV site and redness (pt tolerated with IV benadryl)  . Azithromycin Itching    Pt arm itching after receiving IV Zithromax    Consultations:  urology   Procedures/Studies: CT ABDOMEN PELVIS W CONTRAST  Result Date: 09/13/2019 CLINICAL DATA:  49 year old male with abdominal distension. Scrotal abscess. EXAM: CT ABDOMEN AND PELVIS WITH CONTRAST TECHNIQUE: Multidetector CT imaging of the abdomen and pelvis was performed using the standard protocol following bolus administration of intravenous contrast. CONTRAST:  100mL OMNIPAQUE IOHEXOL 300 MG/ML  SOLN COMPARISON:  CT abdomen pelvis dated 11/24/2017. FINDINGS: Lower chest: The visualized lung bases are clear. No intra-abdominal free air or free fluid. Hepatobiliary: No focal liver abnormality is seen. No gallstones, gallbladder wall thickening, or biliary dilatation. Pancreas: Unremarkable. No pancreatic ductal dilatation or surrounding inflammatory changes. Spleen: Normal in size  without focal abnormality. Adrenals/Urinary Tract: The adrenal glands are unremarkable. There is no hydronephrosis on either side. There is mild right perinephric haziness with faint areas of heterogeneous enhancement in the upper right renal parenchyma concerning for pyelonephritis. Correlation with urinalysis recommended. No drainable fluid collection or abscess. Indeterminate 1 cm hypodense lesion in the inferior pole of the right kidney similar to prior CT. This can be better evaluated with MRI as suggested on the ultrasound of 11/24/2017. The visualized ureters and urinary bladder are grossly unremarkable. Stomach/Bowel: There is no bowel obstruction or active inflammation. The appendix is normal. Vascular/Lymphatic: Mild aortoiliac atherosclerotic disease. The IVC is unremarkable. No portal venous gas. There is no adenopathy. Reproductive: The prostate and seminal vesicles are grossly unremarkable. There is diffuse scrotal wall thickening and edema. There is a 3.6 x 3.7 cm fluid collection in the right scrotal wall most consistent with an abscess. This can be better evaluated with ultrasound. Other: There is diffuse lobulated thickening of the subcutaneous soft  tissues of the gluteal region with small pockets of air as seen on the prior CT. This is of indeterminate etiology and may be related to liposuction but concerning for a superimposed infection. Clinical correlation is recommended. No drainable fluid collection identified. Musculoskeletal: No acute or significant osseous findings. IMPRESSION: 1. Diffuse scrotal wall thickening and edema with a 3.6 x 3.7 cm right scrotal wall abscess. This can be better evaluated with ultrasound. 2. Diffuse lobulated thickening of the subcutaneous soft tissues of the gluteal region with small pockets of air as seen on the prior CT. This is of indeterminate etiology and may be related to liposuction but concerning for a superimposed infection. Clinical correlation is  recommended. No drainable fluid collection identified. 3. Findings concerning for right-sided pyelonephritis. Correlation with urinalysis recommended. No drainable fluid collection or abscess. 4. No bowel obstruction. Normal appendix. 5. Aortic Atherosclerosis (ICD10-I70.0). Electronically Signed   By: Elgie Collard M.D.   On: 09/13/2019 15:55       Subjective: Has no complaints this am. Pain controlled.   Discharge Exam: Vitals:   09/15/19 0518 09/15/19 1129  BP: 118/85 102/68  Pulse: 73 66  Resp: 16 16  Temp: 98.2 F (36.8 C) 98.6 F (37 C)  SpO2: 100% 98%   Vitals:   09/14/19 1219 09/14/19 2012 09/15/19 0518 09/15/19 1129  BP: 119/85 117/86 118/85 102/68  Pulse: 68 65 73 66  Resp: 14 20 16 16   Temp: 98.6 F (37 C) (!) 97.4 F (36.3 C) 98.2 F (36.8 C) 98.6 F (37 C)  TempSrc:  Oral Oral Oral  SpO2: 99% 98% 100% 98%  Weight:      Height:        General: Pt is alert, awake, not in acute distress Cardiovascular: RRR, S1/S2 +, no rubs, no gallops Respiratory: CTA bilaterally, no wheezing, no rhonchi Abdominal: Soft, NT, ND, bowel sounds + GU: scrotal wound packed Extremities: no edema, no cyanosis    The results of significant diagnostics from this hospitalization (including imaging, microbiology, ancillary and laboratory) are listed below for reference.     Microbiology: Recent Results (from the past 240 hour(s))  Blood culture (routine x 2)     Status: None (Preliminary result)   Collection Time: 09/13/19  3:01 PM   Specimen: BLOOD  Result Value Ref Range Status   Specimen Description BLOOD LEFT ANTECUBITAL  Final   Special Requests   Final    BOTTLES DRAWN AEROBIC AND ANAEROBIC Blood Culture adequate volume   Culture   Final    NO GROWTH 2 DAYS Performed at [facility], [address], [place], [place]    Report Status PENDING  Incomplete  Blood culture (routine x 2)     Status: None (Preliminary result)   Collection Time:  09/13/19  3:01 PM   Specimen: BLOOD  Result Value Ref Range Status   Specimen Description BLOOD BLOOD LEFT FOREARM  Final   Special Requests   Final    BOTTLES DRAWN AEROBIC AND ANAEROBIC Blood Culture adequate volume   Culture   Final    NO GROWTH 2 DAYS Performed at [facility], [address], [place], [place]    Report Status PENDING  Incomplete  SARS Coronavirus 2 by RT PCR (hospital order, performed in [facility] hospital lab) Nasopharyngeal Nasopharyngeal Swab     Status: None   Collection Time: 09/13/19  4:16 PM   Specimen: Nasopharyngeal Swab  Result Value Ref Range Status  SARS Coronavirus 2 NEGATIVE NEGATIVE Final    Comment: (NOTE) SARS-CoV-2 target nucleic acids are NOT DETECTED.  The SARS-CoV-2 RNA is generally detectable in upper and lower respiratory specimens during the acute phase of infection. The lowest concentration of SARS-CoV-2 viral copies this assay can detect is 250 copies / mL. A negative result does not preclude SARS-CoV-2 infection and should not be used as the sole basis for treatment or other patient management decisions.  A negative result may occur with improper specimen collection / handling, submission of specimen other than nasopharyngeal swab, presence of viral mutation(s) within the areas targeted by this assay, and inadequate number of viral copies (<250 copies / mL). A negative result must be combined with clinical observations, patient history, and epidemiological information.  Fact Sheet for Patients:   StrictlyIdeas.no  Fact Sheet for Healthcare Providers: BankingDealers.co.za  This test is not yet approved or  cleared by the [place] FDA and has been authorized for detection and/or diagnosis of SARS-CoV-2 by FDA under an Emergency Use Authorization (EUA).  This EUA will remain in effect (meaning this test can be used) for the duration of the COVID-19  declaration under Section 564(b)(1) of the Act, 21 U.S.C. section 360bbb-3(b)(1), unless the authorization is terminated or revoked sooner.  Performed at [facility], [address] [place], [place] [address]   Aerobic/Anaerobic Culture (surgical/deep wound)     Status: None (Preliminary result)   Collection Time: 09/14/19 12:30 AM   Specimen: PATH Other; Abscess  Result Value Ref Range Status   Specimen Description   Final    ABSCESS right groin Performed at [facility], [address], [place], [place] [address]    Special Requests   Final    NONE Performed at [facility], [place], [place] [address]    Gram Stain NO WBC SEEN MODERATE GRAM NEGATIVE COCCOBACILLI   Final   Culture   Final    NO GROWTH 1 DAY Performed at [facility], [place] [address], [place], [place] [address]    Report Status PENDING  Incomplete     Labs: BNP (last 3 results) No results for input(s): BNP in the last 8760 hours. Basic Metabolic Panel: Recent Labs  Lab 09/13/19 1233 09/13/19 2020 09/14/19 0432 09/15/19 0837  NA 134*  --  135 134*  K 3.3*  --  3.2* 3.3*  CL 102  --  101 99  CO2 24  --  26 26  GLUCOSE 140*  --  88 83  BUN 8  --  7 6  CREATININE 0.92  --  0.81 0.83  CALCIUM 8.3*  --  7.8* 8.0*  MG  --  1.9  --   --   PHOS  --  3.6  --   --    Liver Function Tests: Recent Labs  Lab 09/13/19 1233  AST 22  ALT 14  ALKPHOS 201*  BILITOT 1.0  PROT 9.3*  ALBUMIN 2.6*   No results for input(s): LIPASE, AMYLASE in the last 168 hours. No results for input(s): AMMONIA in the last 168 hours. CBC: Recent Labs  Lab 09/13/19 1233 09/14/19 0432 09/15/19 0837  WBC 15.4* 15.9* 10.1  NEUTROABS 10.7*  --   --   HGB 12.5* 11.3* 11.8*  HCT 36.5* 34.9* 35.4*  MCV 77.7* 81.4 79.7*  PLT 333 314 308   Cardiac Enzymes: No results for input(s): CKTOTAL, CKMB, CKMBINDEX, TROPONINI in the last 168 hours. BNP: Invalid  input(s):  POCBNP CBG: No results for input(s): GLUCAP in the last 168 hours. D-Dimer No results for input(s): DDIMER in the last 72 hours. Hgb A1c No results for input(s): HGBA1C in the last 72 hours. Lipid Profile No results for input(s): CHOL, HDL, LDLCALC, TRIG, CHOLHDL, LDLDIRECT in the last 72 hours. Thyroid function studies No results for input(s): TSH, T4TOTAL, T3FREE, THYROIDAB in the last 72 hours.  Invalid input(s): FREET3 Anemia work up No results for input(s): VITAMINB12, FOLATE, FERRITIN, TIBC, IRON, RETICCTPCT in the last 72 hours. Urinalysis    Component Value Date/Time   COLORURINE YELLOW (A) 09/13/2019 1233   APPEARANCEUR CLEAR (A) 09/13/2019 1233   LABSPEC 1.016 09/13/2019 1233   PHURINE 5.0 09/13/2019 1233   GLUCOSEU NEGATIVE 09/13/2019 1233   HGBUR SMALL (A) 09/13/2019 1233   BILIRUBINUR NEGATIVE 09/13/2019 1233   KETONESUR NEGATIVE 09/13/2019 1233   PROTEINUR 30 (A) 09/13/2019 1233   NITRITE NEGATIVE 09/13/2019 1233   LEUKOCYTESUR TRACE (A) 09/13/2019 1233   Sepsis Labs Invalid input(s): PROCALCITONIN,  WBC,  LACTICIDVEN Microbiology Recent Results (from the past 240 hour(s))  Blood culture (routine x 2)     Status: None (Preliminary result)   Collection Time: 09/13/19  3:01 PM   Specimen: BLOOD  Result Value Ref Range Status   Specimen Description BLOOD LEFT ANTECUBITAL  Final   Special Requests   Final    BOTTLES DRAWN AEROBIC AND ANAEROBIC Blood Culture adequate volume   Culture   Final    NO GROWTH 2 DAYS Performed at [facility], [address] [place], [place] [address]    Report Status PENDING  Incomplete  Blood culture (routine x 2)     Status: None (Preliminary result)   Collection Time: 09/13/19  3:01 PM   Specimen: BLOOD  Result Value Ref Range Status   Specimen Description BLOOD BLOOD LEFT FOREARM  Final   Special Requests   Final    BOTTLES DRAWN AEROBIC AND ANAEROBIC Blood Culture adequate volume   Culture   Final    NO GROWTH  2 DAYS Performed at [facility], [address] [place], [place] [address]    Report Status PENDING  Incomplete  SARS Coronavirus 2 by RT PCR (hospital order, performed in [facility] hospital lab) Nasopharyngeal Nasopharyngeal Swab     Status: None   Collection Time: 09/13/19  4:16 PM   Specimen: Nasopharyngeal Swab  Result Value Ref Range Status   SARS Coronavirus 2 NEGATIVE NEGATIVE Final    Comment: (NOTE) SARS-CoV-2 target nucleic acids are NOT DETECTED.  The SARS-CoV-2 RNA is generally detectable in upper and lower respiratory specimens during the acute phase of infection. The lowest concentration of SARS-CoV-2 viral copies this assay can detect is 250 copies / mL. A negative result does not preclude SARS-CoV-2 infection and should not be used as the sole basis for treatment or other patient management decisions.  A negative result may occur with improper specimen collection / handling, submission of specimen other than nasopharyngeal swab, presence of viral mutation(s) within the areas targeted by this assay, and inadequate number of viral copies (<250 copies / mL). A negative result must be combined with clinical observations, patient history, and epidemiological information.  Fact Sheet for Patients:   BoilerBrush.com.cy  Fact Sheet for Healthcare Providers: https://pope.com/  This test is not yet approved or  cleared by the [place] FDA and has been authorized for detection and/or diagnosis of SARS-CoV-2 by FDA under an Emergency Use Authorization (  EUA).  This EUA will remain in effect (meaning this test can be used) for the duration of the COVID-19 declaration under Section 564(b)(1) of the Act, 21 U.S.C. section 360bbb-3(b)(1), unless the authorization is terminated or revoked sooner.  Performed at [facility], [address] [place], [place] [address]   Aerobic/Anaerobic Culture  (surgical/deep wound)     Status: None (Preliminary result)   Collection Time: 09/14/19 12:30 AM   Specimen: PATH Other; Abscess  Result Value Ref Range Status   Specimen Description   Final    ABSCESS right groin Performed at [facility], [address], [place], [place] [address]    Special Requests   Final    NONE Performed at [facility], [address] [place], [place] [address]    Gram Stain NO WBC SEEN MODERATE GRAM NEGATIVE COCCOBACILLI   Final   Culture   Final    NO GROWTH 1 DAY Performed at [facility], [address] [place], [place] [address]    Report Status PENDING  Incomplete     Time coordinating discharge: Over 30 minutes  SIGNED:   Lynn Ito, MD  [facility] 09/15/2019, 12:49 PM Pager   If 7PM-7AM, please contact night-coverage [email or web address] Password [email or web address]

## 2019-09-18 LAB — CULTURE, BLOOD (ROUTINE X 2)
Culture: NO GROWTH
Culture: NO GROWTH
Special Requests: ADEQUATE
Special Requests: ADEQUATE

## 2019-09-19 LAB — AEROBIC/ANAEROBIC CULTURE W GRAM STAIN (SURGICAL/DEEP WOUND): Gram Stain: NONE SEEN

## 2019-10-06 ENCOUNTER — Other Ambulatory Visit: Payer: Self-pay

## 2019-10-06 ENCOUNTER — Emergency Department
Admission: EM | Admit: 2019-10-06 | Discharge: 2019-10-06 | Disposition: A | Discharge status: Home / Self Care | Payer: BC Managed Care – PPO | Attending: Student in an Organized Health Care Education/Training Program | Admitting: Student in an Organized Health Care Education/Training Program

## 2019-10-06 DIAGNOSIS — X58XXXA Exposure to other specified factors, initial encounter: Secondary | ICD-10-CM | POA: Insufficient documentation

## 2019-10-06 DIAGNOSIS — L732 Hidradenitis suppurativa: Secondary | ICD-10-CM | POA: Diagnosis not present

## 2019-10-06 DIAGNOSIS — N492 Inflammatory disorders of scrotum: Secondary | ICD-10-CM | POA: Diagnosis not present

## 2019-10-06 DIAGNOSIS — Y939 Activity, unspecified: Secondary | ICD-10-CM | POA: Insufficient documentation

## 2019-10-06 DIAGNOSIS — Y929 Unspecified place or not applicable: Secondary | ICD-10-CM | POA: Diagnosis not present

## 2019-10-06 DIAGNOSIS — F17210 Nicotine dependence, cigarettes, uncomplicated: Secondary | ICD-10-CM | POA: Diagnosis not present

## 2019-10-06 DIAGNOSIS — Y999 Unspecified external cause status: Secondary | ICD-10-CM | POA: Insufficient documentation

## 2019-10-06 DIAGNOSIS — S3130XA Unspecified open wound of scrotum and testes, initial encounter: Secondary | ICD-10-CM | POA: Insufficient documentation

## 2019-10-06 DIAGNOSIS — F1590 Other stimulant use, unspecified, uncomplicated: Secondary | ICD-10-CM | POA: Insufficient documentation

## 2019-10-06 LAB — CBC WITH DIFFERENTIAL/PLATELET
Abs Immature Granulocytes: 0.05 10*3/uL (ref 0.00–0.07)
Basophils Absolute: 0 10*3/uL (ref 0.0–0.1)
Basophils Relative: 0 %
Eosinophils Absolute: 0.1 10*3/uL (ref 0.0–0.5)
Eosinophils Relative: 1 %
HCT: 38 % — ABNORMAL LOW (ref 39.0–52.0)
Hemoglobin: 12.7 g/dL — ABNORMAL LOW (ref 13.0–17.0)
Immature Granulocytes: 0 %
Lymphocytes Relative: 13 %
Lymphs Abs: 1.9 10*3/uL (ref 0.7–4.0)
MCH: 26.6 pg (ref 26.0–34.0)
MCHC: 33.4 g/dL (ref 30.0–36.0)
MCV: 79.7 fL — ABNORMAL LOW (ref 80.0–100.0)
Monocytes Absolute: 1.6 10*3/uL — ABNORMAL HIGH (ref 0.1–1.0)
Monocytes Relative: 11 %
Neutro Abs: 11.1 10*3/uL — ABNORMAL HIGH (ref 1.7–7.7)
Neutrophils Relative %: 75 %
Platelets: 347 10*3/uL (ref 150–400)
RBC: 4.77 MIL/uL (ref 4.22–5.81)
RDW: 16.8 % — ABNORMAL HIGH (ref 11.5–15.5)
WBC: 14.8 10*3/uL — ABNORMAL HIGH (ref 4.0–10.5)
nRBC: 0 % (ref 0.0–0.2)

## 2019-10-06 LAB — COMPREHENSIVE METABOLIC PANEL
ALT: 22 U/L (ref 0–44)
AST: 21 U/L (ref 15–41)
Albumin: 2.7 g/dL — ABNORMAL LOW (ref 3.5–5.0)
Alkaline Phosphatase: 272 U/L — ABNORMAL HIGH (ref 38–126)
Anion gap: 7 (ref 5–15)
BUN: 6 mg/dL (ref 6–20)
CO2: 28 mmol/L (ref 22–32)
Calcium: 8.1 mg/dL — ABNORMAL LOW (ref 8.9–10.3)
Chloride: 103 mmol/L (ref 98–111)
Creatinine, Ser: 0.92 mg/dL (ref 0.61–1.24)
GFR calc Af Amer: >60 mL/min (ref >60)
GFR calc non Af Amer: >60 mL/min (ref >60)
Glucose, Bld: 91 mg/dL (ref 70–99)
Potassium: 3.5 mmol/L (ref 3.5–5.1)
Sodium: 138 mmol/L (ref 135–145)
Total Bilirubin: 0.6 mg/dL (ref 0.3–1.2)
Total Protein: 9.5 g/dL — ABNORMAL HIGH (ref 6.5–8.1)

## 2019-10-06 MED ORDER — CLINDAMYCIN HCL 300 MG PO CAPS
300.0000 mg | ORAL_CAPSULE | Freq: Three times a day (TID) | ORAL | 0 refills | Status: DC
Start: 2019-10-06 — End: 2019-11-10

## 2019-10-06 MED ORDER — CLINDAMYCIN PHOSPHATE 600 MG/50ML IV SOLN
600.0000 mg | Freq: Once | INTRAVENOUS | Status: AC
  Administered 2019-10-06: 600 mg via INTRAVENOUS
  Filled 2019-10-06: qty 50

## 2019-10-06 MED ORDER — OXYCODONE-ACETAMINOPHEN 5-325 MG PO TABS: 1.0000 | ORAL_TABLET | Freq: Four times a day (QID) | ORAL | 0 refills | Status: DC | PRN

## 2019-10-06 NOTE — ED Triage Notes (Signed)
Reports abscess "in between my legs" X 1 week.

## 2019-10-06 NOTE — Discharge Instructions (Signed)
Call make an appointment follow-up with Dr. Lonna Cobb who was the surgeon for your abscess.  Begin taking the clindamycin as directed.  Also sits baths or warm moist compresses frequently.  Should you began having fever, chills, nausea, vomiting or worsening of your abscess return to the emergency department.  A prescription for antibiotics and pain medication was sent to your pharmacy.

## 2019-10-06 NOTE — ED Provider Notes (Signed)
[facility] Emergency Department Provider Note   ____________________________________________   First MD Initiated Contact with Patient 10/06/19 1322     (approximate)  I have reviewed the triage vital signs and the nursing notes.   HISTORY  Chief Complaint Abscess    HPI Benjamin Owens is a 49 y.o. male resents to the ED with complaint of drainage from a recent abscess in the scrotal area.  Patient was seen in the ED on 09/13/2019 for a scrotal abscess that required I&D by Dr. Lonna Cobb in surgery.  Patient has a history of hidadenitis and has had multiple I&D's in the past.  He states there is been no fever, chills, nausea or vomiting.  He still continues to have some drainage from the scrotal area but states that this is greatly improved over his last visit to the ED.  He rates his pain as a 10 out of 10.      Past Medical History:  Diagnosis Date   Hay fever    Hidradenitis suppurativa     Patient Active Problem List   Diagnosis Date Noted   Abscess of scrotal wall 09/13/2019   Alcohol dependence (HCC) 09/13/2019   Chest pain 03/29/2018   Nicotine dependence, uncomplicated 11/05/2015   Hidradenitis suppurativa 01/17/2013    Past Surgical History:  Procedure Laterality Date   INCISION AND DRAINAGE ABSCESS Right 09/13/2019   Procedure: INCISION AND DRAINAGE ABSCESS;  Surgeon: Riki Altes, MD;  Location: [facility] ORS;  Service: Urology;  Laterality: Right;   NO PAST SURGERIES      Prior to Admission medications   Medication Sig Start Date End Date Taking? Authorizing Provider  calcium carbonate (OS-CAL) 600 MG TABS tablet Take 1 tablet (600 mg total) by mouth 2 (two) times daily with a meal for 7 days. 04/20/18 04/27/18  Dionne Bucy, MD  clindamycin (CLEOCIN) 300 MG capsule Take 1 capsule (300 mg total) by mouth 3 (three) times daily. 10/06/19   Tommi Rumps, PA-C  folic acid (FOLVITE) 1 MG tablet Take 1 tablet (1 mg total)  by mouth daily. 09/16/19   Lynn Ito, MD  inFLIXimab (REMICADE) 100 MG injection Inject into the vein.    [provider]  oxyCODONE-acetaminophen (PERCOCET) 5-325 MG tablet Take 1 tablet by mouth every 6 (six) hours as needed for severe pain. 10/06/19   Tommi Rumps, PA-C  potassium chloride (K-DUR) 10 MEQ tablet Take 1 tablet (10 mEq total) by mouth 2 (two) times daily for 7 days. 04/20/18 04/27/18  Dionne Bucy, MD  thiamine 100 MG tablet Take 1 tablet (100 mg total) by mouth daily. 09/16/19   Lynn Ito, MD    Allergies Vancomycin and Azithromycin  Family History  Problem Relation Age of Onset   Stroke Mother    Heart disease Mother        Degenerative heart disease   Diabetes Mother    Throat cancer Father    Lung cancer Maternal Aunt    Breast cancer Paternal Aunt    Lung cancer Paternal Uncle        smoker   Healthy Maternal Grandmother    Heart attack Maternal Grandfather    Breast cancer Paternal Grandmother    Heart attack Paternal Grandfather    Healthy Cousin    Prostate cancer Paternal Uncle    Breast cancer Maternal Aunt     Social History Social History   Tobacco Use   Smoking status: Current Every Day Smoker  Packs/day: 0.25    Types: Cigarettes   Smokeless tobacco: Never Used  Vaping Use   Vaping Use: Never used  Substance Use Topics   Alcohol use: Yes    Alcohol/week: 3.0 standard drinks    Types: 3 Cans of beer per week    Comment: weekdays 3 x 16 oz beers; Weekend approx 2 cases over Sat/Sun.   Drug use: Yes    Types: Marijuana    Review of Systems Constitutional: No fever/chills Eyes: No visual changes. ENT: No sore throat. Cardiovascular: Denies chest pain. Respiratory: Denies shortness of breath. Gastrointestinal: No abdominal pain.  No nausea, no vomiting.  No diarrhea.  No constipation. Genitourinary: Negative for dysuria.  Positive history of scrotal abscess. Musculoskeletal: Negative for back  pain. Skin: Positive for hidradenitis Neurological: Negative for headaches, focal weakness or numbness.  ____________________________________________   PHYSICAL EXAM:  VITAL SIGNS: ED Triage Vitals  Enc Vitals Group     BP 10/06/19 1232 116/79     Pulse Rate 10/06/19 1232 80     Resp 10/06/19 1232 16     Temp 10/06/19 1232 98.8 F (37.1 C)     Temp Source 10/06/19 1232 Oral     SpO2 10/06/19 1232 99 %     Weight 10/06/19 1217 175 lb (79.4 kg)     Height 10/06/19 1217 6' (1.829 m)     Head Circumference --      Peak Flow --      Pain Score 10/06/19 1216 10     Pain Loc --      Pain Edu? --      Excl. in GC? --     Constitutional: Alert and oriented. Well appearing and in no acute distress. Eyes: Conjunctivae are normal.  Head: Atraumatic. Neck: No stridor.   Cardiovascular: Normal rate, regular rhythm. Grossly normal heart sounds.  Good peripheral circulation. Respiratory: Normal respiratory effort.  No retractions. Lungs CTAB. Gastrointestinal: Soft and nontender. No distention. Genitourinary: Examination of the scrotal area there are numerous areas of scars resulting from prior I&D's and also noted to the inner thigh and buttocks bilaterally.  Scrotum has open healing areas.  Posteriorly there continues to be purulent drainage without erythema or warmth. Musculoskeletal: No lower extremity tenderness nor edema.  No joint effusions. Neurologic:  Normal speech and language. No gross focal neurologic deficits are appreciated.  Skin:  Skin is warm, dry and intact. No rash noted. Psychiatric: Mood and affect are normal. Speech and behavior are normal.  ____________________________________________   LABS (all labs ordered are listed, but only abnormal results are displayed)  Labs Reviewed  CBC WITH DIFFERENTIAL/PLATELET - Abnormal; Notable for the following components:      Result Value   WBC 14.8 (*)    Hemoglobin 12.7 (*)    HCT 38.0 (*)    MCV 79.7 (*)    RDW 16.8  (*)    Neutro Abs 11.1 (*)    Monocytes Absolute 1.6 (*)    All other components within normal limits  COMPREHENSIVE METABOLIC PANEL - Abnormal; Notable for the following components:   Calcium 8.1 (*)    Total Protein 9.5 (*)    Albumin 2.7 (*)    Alkaline Phosphatase 272 (*)    All other components within normal limits    PROCEDURES  Procedure(s) performed (including Critical Care):  Procedures   ____________________________________________   INITIAL IMPRESSION / ASSESSMENT AND PLAN / ED COURSE  As part of my medical decision making, I reviewed  the following data within the electronic MEDICAL RECORD NUMBER Notes from prior ED visits and [place] Controlled Substance Database  49 year old male presents to the ED with continued drainage from his scrotum.  Patient was seen in the ED on 09/13/2019 at which time he saw Dr. Lonna Cobb and an  I&D was performed in the OR.  Patient states that his continue to get better but he still continues to have drainage.  He denies any fever, chills, nausea or vomiting.  Area appears to be healing but there is still continued purulent drainage.  He was encouraged to use warm compresses or do sits baths twice a day.  He was given clindamycin 600 mg IV while in the ED.  He is also discharged with prescription for clindamycin 300 mg 3 times daily and oxycodone every 6 hours as needed for pain.  He is given strict return precautions should he began having fever, chills, nausea or vomiting or just inability to take the antibiotic he is to return to the ED.  ____________________________________________   FINAL CLINICAL IMPRESSION(S) / ED DIAGNOSES  Final diagnoses:  Abscess, scrotum  Hidradenitis suppurativa     ED Discharge Orders         Ordered    clindamycin (CLEOCIN) 300 MG capsule  3 times daily     Discontinue  Reprint     10/06/19 1542    oxyCODONE-acetaminophen (PERCOCET) 5-325 MG tablet  Every 6 hours PRN     Discontinue  Reprint     10/06/19 1542             Note:  This document was prepared using Dragon voice recognition software and may include unintentional dictation errors.    Tommi Rumps, PA-C 10/06/19 1612    Willy Eddy, MD 10/07/19 [phone]

## 2019-10-17 ENCOUNTER — Encounter: Payer: Self-pay | Admitting: Urology

## 2019-10-27 DIAGNOSIS — F17210 Nicotine dependence, cigarettes, uncomplicated: Secondary | ICD-10-CM | POA: Diagnosis not present

## 2019-10-27 DIAGNOSIS — L732 Hidradenitis suppurativa: Secondary | ICD-10-CM | POA: Diagnosis not present

## 2019-10-27 DIAGNOSIS — Z79899 Other long term (current) drug therapy: Secondary | ICD-10-CM | POA: Diagnosis not present

## 2019-10-27 DIAGNOSIS — R609 Edema, unspecified: Secondary | ICD-10-CM | POA: Diagnosis not present

## 2019-10-27 DIAGNOSIS — Z6823 Body mass index (BMI) 23.0-23.9, adult: Secondary | ICD-10-CM | POA: Diagnosis not present

## 2019-10-27 DIAGNOSIS — R1900 Intra-abdominal and pelvic swelling, mass and lump, unspecified site: Secondary | ICD-10-CM | POA: Diagnosis not present

## 2019-10-27 DIAGNOSIS — N492 Inflammatory disorders of scrotum: Secondary | ICD-10-CM | POA: Diagnosis not present

## 2019-10-27 DIAGNOSIS — N5089 Other specified disorders of the male genital organs: Secondary | ICD-10-CM | POA: Diagnosis not present

## 2019-10-27 DIAGNOSIS — M799 Soft tissue disorder, unspecified: Secondary | ICD-10-CM | POA: Diagnosis not present

## 2019-10-27 DIAGNOSIS — R1032 Left lower quadrant pain: Secondary | ICD-10-CM | POA: Diagnosis not present

## 2019-10-27 DIAGNOSIS — R1031 Right lower quadrant pain: Secondary | ICD-10-CM | POA: Diagnosis not present

## 2019-10-27 DIAGNOSIS — R1909 Other intra-abdominal and pelvic swelling, mass and lump: Secondary | ICD-10-CM | POA: Diagnosis not present

## 2019-10-28 DIAGNOSIS — M799 Soft tissue disorder, unspecified: Secondary | ICD-10-CM | POA: Diagnosis not present

## 2019-10-28 DIAGNOSIS — R609 Edema, unspecified: Secondary | ICD-10-CM | POA: Diagnosis not present

## 2019-11-10 ENCOUNTER — Ambulatory Visit (INDEPENDENT_AMBULATORY_CARE_PROVIDER_SITE_OTHER): Payer: BC Managed Care – PPO | Admitting: Family Medicine

## 2019-11-10 ENCOUNTER — Encounter: Payer: Self-pay | Admitting: Family Medicine

## 2019-11-10 ENCOUNTER — Other Ambulatory Visit: Payer: Self-pay

## 2019-11-10 VITALS — BP 103/65 | HR 71 | Ht 72.0 in | Wt 164.4 lb

## 2019-11-10 DIAGNOSIS — L732 Hidradenitis suppurativa: Secondary | ICD-10-CM | POA: Diagnosis not present

## 2019-11-10 DIAGNOSIS — M2550 Pain in unspecified joint: Secondary | ICD-10-CM | POA: Insufficient documentation

## 2019-11-10 MED ORDER — PREDNISONE 20 MG PO TABS: 40.0000 mg | ORAL_TABLET | Freq: Every day | ORAL | 0 refills | Status: AC

## 2019-11-10 NOTE — Progress Notes (Signed)
Subjective:    Patient ID: Benjamin Owens, male    DOB: 04-Jan-1971, 49 y.o.   MRN: [id number]  Benjamin Owens is a 49 y.o. male presenting on 11/10/2019 for Foot Swelling (bilateral foot pain and swelling x 1.5 week. More swelling in the Rt foot with a constant burning pain)   HPI  Benjamin Owens presents to clinic for evaluation of bilateral foot swelling x 1.5 weeks.  Reports that he has swelling that progresses throughout the day, gets better when elevating feet and after sleeping.  Denies any history of gout and reports discomfort through entire foot.  Has taken over the counter ibuprofen to help with symptoms without any relief.  Denies numbness, tingling, weakness, shortness of breath, cough, CP, previous ankle/foot injury.  Depression screen PHQ 2/9 12/25/2017  Decreased Interest 0  Down, Depressed, Hopeless 0  PHQ - 2 Score 0    Social History   Tobacco Use  . Smoking status: Current Every Day Smoker    Packs/day: 0.25    Types: Cigarettes  . Smokeless tobacco: Never Used  Vaping Use  . Vaping Use: Never used  Substance Use Topics  . Alcohol use: Not Currently    Alcohol/week: 3.0 standard drinks    Types: 3 Cans of beer per week    Comment: weekdays 3 x 16 oz beers; Weekend approx 2 cases over Sat/Sun.  . Drug use: Yes    Types: Marijuana    Review of Systems  Constitutional: Negative.   HENT: Negative.   Eyes: Negative.   Respiratory: Negative.   Cardiovascular: Negative.   Gastrointestinal: Negative.   Endocrine: Negative.   Genitourinary: Negative.   Musculoskeletal: Positive for arthralgias. Negative for back pain, gait problem, joint swelling, myalgias, neck pain and neck stiffness.  Skin: Negative.   Allergic/Immunologic: Negative.   Neurological: Negative.   Hematological: Negative.   Psychiatric/Behavioral: Negative.    Per HPI unless specifically indicated above     Objective:    BP 103/65 (BP Location: Left Arm, Patient Position: Sitting,  Cuff Size: Normal)   Pulse 71   Ht 6' (1.829 m)   Wt 164 lb 6.4 oz (74.6 kg)   BMI 22.30 kg/m   Wt Readings from Last 3 Encounters:  11/10/19 164 lb 6.4 oz (74.6 kg)  10/06/19 175 lb (79.4 kg)  09/13/19 175 lb (79.4 kg)    Physical Exam Vitals reviewed.  Constitutional:      General: He is not in acute distress.    Appearance: Normal appearance. He is well-developed, well-groomed and normal weight. He is not ill-appearing or toxic-appearing.  HENT:     Head: Normocephalic and atraumatic.     Nose:     Comments: Lesia Sago is in place, covering mouth and nose. Eyes:     General:        Right eye: No discharge.        Left eye: No discharge.     Extraocular Movements: Extraocular movements intact.     Conjunctiva/sclera: Conjunctivae normal.     Pupils: Pupils are equal, round, and reactive to light.  Cardiovascular:     Rate and Rhythm: Normal rate and regular rhythm.     Pulses:          Dorsalis pedis pulses are 1+ on the right side and 1+ on the left side.     Heart sounds: Normal heart sounds. No murmur heard.  No friction rub. No gallop.   Pulmonary:     Effort:  Pulmonary effort is normal. No respiratory distress.     Breath sounds: Normal breath sounds.  Musculoskeletal:        General: Tenderness present.  Feet:     Right foot:     Skin integrity: Skin integrity normal.     Left foot:     Skin integrity: Skin integrity normal.     Comments: Swelling in bilateral feet to base of ankles, non-pitting, responsive to elevation.  Tenderness to palpation to arch of foot/metatarsals. Skin:    General: Skin is warm and dry.     Capillary Refill: Capillary refill takes less than 2 seconds.  Neurological:     General: No focal deficit present.     Mental Status: He is alert and oriented to person, place, and time.  Psychiatric:        Attention and Perception: Attention and perception normal.        Mood and Affect: Mood and affect normal.        Speech: Speech normal.         Behavior: Behavior normal. Behavior is cooperative.        Thought Content: Thought content normal.        Cognition and Memory: Cognition and memory normal.    Results for orders placed or performed during the hospital encounter of 10/06/19  CBC with Differential  Result Value Ref Range   WBC 14.8 (H) 4.0 - 10.5 K/uL   RBC 4.77 4.22 - 5.81 MIL/uL   Hemoglobin 12.7 (L) 13.0 - 17.0 g/dL   HCT 41.9 (L) 39 - 52 %   MCV 79.7 (L) 80.0 - 100.0 fL   MCH 26.6 26.0 - 34.0 pg   MCHC 33.4 30.0 - 36.0 g/dL   RDW 37.9 (H) 02.4 - 09.7 %   Platelets 347 150 - 400 K/uL   nRBC 0.0 0.0 - 0.2 %   Neutrophils Relative % 75 %   Neutro Abs 11.1 (H) 1.7 - 7.7 K/uL   Lymphocytes Relative 13 %   Lymphs Abs 1.9 0.7 - 4.0 K/uL   Monocytes Relative 11 %   Monocytes Absolute 1.6 (H) 0 - 1 K/uL   Eosinophils Relative 1 %   Eosinophils Absolute 0.1 0 - 0 K/uL   Basophils Relative 0 %   Basophils Absolute 0.0 0 - 0 K/uL   Immature Granulocytes 0 %   Abs Immature Granulocytes 0.05 0.00 - 0.07 K/uL  Comprehensive metabolic panel  Result Value Ref Range   Sodium 138 135 - 145 mmol/L   Potassium 3.5 3.5 - 5.1 mmol/L   Chloride 103 98 - 111 mmol/L   CO2 28 22 - 32 mmol/L   Glucose, Bld 91 70 - 99 mg/dL   BUN 6 6 - 20 mg/dL   Creatinine, Ser 3.53 0.61 - 1.24 mg/dL   Calcium 8.1 (L) 8.9 - 10.3 mg/dL   Total Protein 9.5 (H) 6.5 - 8.1 g/dL   Albumin 2.7 (L) 3.5 - 5.0 g/dL   AST 21 15 - 41 U/L   ALT 22 0 - 44 U/L   Alkaline Phosphatase 272 (H) 38 - 126 U/L   Total Bilirubin 0.6 0.3 - 1.2 mg/dL   GFR calc non Af Amer >60 >60 mL/min   GFR calc Af Amer >60 >60 mL/min   Anion gap 7 5 - 15      Assessment & Plan:   Problem List Items Addressed This Visit      Musculoskeletal and Integument  Hidradenitis suppurativa    Currently following with [facility] for acute exacerbations.      Relevant Medications   cefdinir (OMNICEF) 300 MG capsule   metroNIDAZOLE (FLAGYL) 500 MG tablet     Other     Polyarthralgia - Primary    Patient with bilateral foot swelling with worse swelling in the right foot x 1.5 weeks.  Does resolve with putting feet up and after sleeping at night.  Denies history of gout.  Reports recent flare of his hidradenitis supportiva, which reports in the past has caused a flare of his polyarthralgia.  Will treat with short 5 day course of prednisone.  Encouraged to rest, elevate feet and can wear compression stockings to help reduce inflammation.  Plan: 1. Begin prednisone 40mg  daily x 5 days 2. Elevate feet when resting 3. Begin to wear compression stockings 4. RTC as needed      Relevant Medications   predniSONE (DELTASONE) 20 MG tablet      Meds ordered this encounter  Medications  . predniSONE (DELTASONE) 20 MG tablet    Sig: Take 2 tablets (40 mg total) by mouth daily with breakfast for 5 days.    Dispense:  10 tablet    Refill:  0      Follow up plan: Return in about 4 weeks (around 12/08/2019) for CPE.   12/10/2019, FNP Family Nurse Practitioner [facility] 11/10/2019, 3:03 PM

## 2019-11-10 NOTE — Patient Instructions (Signed)
As we discussed, likely flare of polyarthralgia.  I have sent in a prescription for prednisone to take 40mg  daily for the next 5 days  Can elevated the feet/legs when seated and would encourage compression socks, if you can tolerate, to help reduce the swelling.  We will plan to see you back in 4 weeks for your physical and sooner if your symptoms worsen or fail to improve  You will receive a survey after today's visit either digitally by e-mail or paper by . Your experiences and feedback matter to [employer].  Please respond so we know how we are doing as we provide care for you.  Call us with any questions/concerns/needs.  It is my goal to be available to you for your health concerns.  Thanks for choosing me to be a partner in your healthcare needs!  [place], FNP-C Family Nurse Practitioner [facility] Phone: [phone]

## 2019-11-10 NOTE — Assessment & Plan Note (Addendum)
Patient with bilateral foot swelling with worse swelling in the right foot x 1.5 weeks.  Does resolve with putting feet up and after sleeping at night.  Denies history of gout.  Reports recent flare of his hidradenitis supportiva, which reports in the past has caused a flare of his polyarthralgia.  Will treat with short 5 day course of prednisone.  Encouraged to rest, elevate feet and can wear compression stockings to help reduce inflammation.  Plan: 1. Begin prednisone 40mg  daily x 5 days 2. Elevate feet when resting 3. Begin to wear compression stockings 4. RTC as needed

## 2019-11-11 NOTE — Assessment & Plan Note (Signed)
Currently following with [facility] for acute exacerbations.

## 2019-11-16 ENCOUNTER — Other Ambulatory Visit: Payer: Self-pay | Admitting: Family Medicine

## 2019-11-16 NOTE — Telephone Encounter (Signed)
Pt called stating that he finished the prednisone that was prescribed to him. Pt states that the pain has come back to his feet and is requesting to have a refill. Please advise.     [facility] [address] [place] - [address]  [address] [place] [address]  Phone: [phone] Fax: [phone]  Hours: Not open 24 hours

## 2019-11-17 ENCOUNTER — Encounter: Payer: Self-pay | Admitting: Family Medicine

## 2019-11-17 ENCOUNTER — Other Ambulatory Visit: Payer: Self-pay

## 2019-11-17 ENCOUNTER — Ambulatory Visit
Admission: RE | Admit: 2019-11-17 | Discharge: 2019-11-17 | Disposition: A | Discharge status: Home / Self Care | Payer: BC Managed Care – PPO | Attending: Family Medicine | Admitting: Family Medicine

## 2019-11-17 ENCOUNTER — Ambulatory Visit
Admission: RE | Admit: 2019-11-17 | Discharge: 2019-11-17 | Disposition: A | Discharge status: Home / Self Care | Payer: BC Managed Care – PPO | Source: Ambulatory Visit | Attending: Family Medicine | Admitting: Family Medicine

## 2019-11-17 ENCOUNTER — Ambulatory Visit (INDEPENDENT_AMBULATORY_CARE_PROVIDER_SITE_OTHER): Payer: BC Managed Care – PPO | Admitting: Family Medicine

## 2019-11-17 VITALS — BP 107/72 | HR 78 | Temp 98.3°F | Resp 17 | Ht 72.0 in | Wt 158.2 lb

## 2019-11-17 DIAGNOSIS — M7989 Other specified soft tissue disorders: Secondary | ICD-10-CM | POA: Diagnosis not present

## 2019-11-17 DIAGNOSIS — M2550 Pain in unspecified joint: Secondary | ICD-10-CM

## 2019-11-17 DIAGNOSIS — M2011 Hallux valgus (acquired), right foot: Secondary | ICD-10-CM | POA: Diagnosis not present

## 2019-11-17 DIAGNOSIS — M79672 Pain in left foot: Secondary | ICD-10-CM | POA: Diagnosis not present

## 2019-11-17 NOTE — Progress Notes (Signed)
Subjective:    Patient ID: Benjamin Owens, male    DOB: Feb 16, 1971, 49 y.o.   MRN: [id number]  Benjamin Owens is a 49 y.o. male presenting on 11/17/2019 for Foot Pain (bilateral foot pain with the left foot hurting worse then the Rt. Pain worsen with prolong walking.He describe the pain as a aching, burning crampy feeling in the bottom of his feet. Pt state he took the Predinosone x 5 days and the symptoms subsided while on the medication. Two days after finishing the medication the symptoms returned)   HPI  [name] presents to clinic for re-evaluation of bilateral foot pain.  Reports that he had symptom resolution with the 5 days of prednisone and within 2 days of completing prednisone, his symptoms returned.  States symptoms improve over night and are not worse first thing in the morning.  The symptoms progress throughout the day, with increased pain with prolonged standing/walking.  Denies any previous injury to feet.  Depression screen PHQ 2/9 12/25/2017  Decreased Interest 0  Down, Depressed, Hopeless 0  PHQ - 2 Score 0    Social History   Tobacco Use  . Smoking status: Current Every Day Smoker    Packs/day: 0.25    Types: Cigarettes  . Smokeless tobacco: Never Used  Vaping Use  . Vaping Use: Never used  Substance Use Topics  . Alcohol use: Not Currently    Alcohol/week: 3.0 standard drinks    Types: 3 Cans of beer per week    Comment: weekdays 3 x 16 oz beers; Weekend approx 2 cases over Sat/Sun.  . Drug use: Yes    Types: Marijuana    Review of Systems  Constitutional: Negative.   HENT: Negative.   Eyes: Negative.   Respiratory: Negative.   Cardiovascular: Negative.   Gastrointestinal: Negative.   Endocrine: Negative.   Genitourinary: Negative.   Musculoskeletal: Positive for arthralgias. Negative for back pain, gait problem, joint swelling, myalgias, neck pain and neck stiffness.  Skin: Negative.   Allergic/Immunologic: Negative.   Neurological:  Negative.   Hematological: Negative.   Psychiatric/Behavioral: Negative.    Per HPI unless specifically indicated above     Objective:    BP 107/72 (BP Location: Left Arm, Patient Position: Sitting, Cuff Size: Normal)   Pulse 78   Temp 98.3 F (36.8 C) (Oral)   Resp 17   Ht 6' (1.829 m)   Wt 158 lb 3.2 oz (71.8 kg)   SpO2 100%   BMI 21.46 kg/m   Wt Readings from Last 3 Encounters:  11/17/19 158 lb 3.2 oz (71.8 kg)  11/10/19 164 lb 6.4 oz (74.6 kg)  10/06/19 175 lb (79.4 kg)    Physical Exam Vitals reviewed.  Constitutional:      General: He is not in acute distress.    Appearance: Normal appearance. He is well-developed, well-groomed and normal weight. He is not ill-appearing or toxic-appearing.  HENT:     Head: Normocephalic and atraumatic.     Nose:     Comments: Benjamin Owens is in place, covering mouth and nose. Eyes:     General:        Right eye: No discharge.        Left eye: No discharge.     Extraocular Movements: Extraocular movements intact.     Conjunctiva/sclera: Conjunctivae normal.     Pupils: Pupils are equal, round, and reactive to light.  Cardiovascular:     Pulses: Normal pulses.  Dorsalis pedis pulses are 2+ on the right side and 2+ on the left side.  Pulmonary:     Effort: Pulmonary effort is normal. No respiratory distress.  Musculoskeletal:        General: Tenderness present.     Right lower leg: No edema.     Left lower leg: No edema.     Right foot: Normal range of motion and normal capillary refill. Swelling and tenderness present. No foot drop. Normal pulse.     Left foot: Normal range of motion and normal capillary refill. Swelling and tenderness present. No foot drop. Normal pulse.     Comments: Bilateral foot swelling to arch, and discomfort with palpation to metatarsals  Skin:    General: Skin is warm and dry.     Capillary Refill: Capillary refill takes less than 2 seconds.  Neurological:     General: No focal deficit present.      Mental Status: He is alert and oriented to person, place, and time.  Psychiatric:        Attention and Perception: Attention and perception normal.        Mood and Affect: Mood and affect normal.        Speech: Speech normal.        Behavior: Behavior normal. Behavior is cooperative.        Thought Content: Thought content normal.        Cognition and Memory: Cognition and memory normal.    Results for orders placed or performed during the hospital encounter of 10/06/19  CBC with Differential  Result Value Ref Range   WBC 14.8 (H) 4.0 - 10.5 K/uL   RBC 4.77 4.22 - 5.81 MIL/uL   Hemoglobin 12.7 (L) 13.0 - 17.0 g/dL   HCT 62.0 (L) 39 - 52 %   MCV 79.7 (L) 80.0 - 100.0 fL   MCH 26.6 26.0 - 34.0 pg   MCHC 33.4 30.0 - 36.0 g/dL   RDW 35.5 (H) 97.4 - 16.3 %   Platelets 347 150 - 400 K/uL   nRBC 0.0 0.0 - 0.2 %   Neutrophils Relative % 75 %   Neutro Abs 11.1 (H) 1.7 - 7.7 K/uL   Lymphocytes Relative 13 %   Lymphs Abs 1.9 0.7 - 4.0 K/uL   Monocytes Relative 11 %   Monocytes Absolute 1.6 (H) 0 - 1 K/uL   Eosinophils Relative 1 %   Eosinophils Absolute 0.1 0 - 0 K/uL   Basophils Relative 0 %   Basophils Absolute 0.0 0 - 0 K/uL   Immature Granulocytes 0 %   Abs Immature Granulocytes 0.05 0.00 - 0.07 K/uL  Comprehensive metabolic panel  Result Value Ref Range   Sodium 138 135 - 145 mmol/L   Potassium 3.5 3.5 - 5.1 mmol/L   Chloride 103 98 - 111 mmol/L   CO2 28 22 - 32 mmol/L   Glucose, Bld 91 70 - 99 mg/dL   BUN 6 6 - 20 mg/dL   Creatinine, Ser 8.45 0.61 - 1.24 mg/dL   Calcium 8.1 (L) 8.9 - 10.3 mg/dL   Total Protein 9.5 (H) 6.5 - 8.1 g/dL   Albumin 2.7 (L) 3.5 - 5.0 g/dL   AST 21 15 - 41 U/L   ALT 22 0 - 44 U/L   Alkaline Phosphatase 272 (H) 38 - 126 U/L   Total Bilirubin 0.6 0.3 - 1.2 mg/dL   GFR calc non Af Amer >60 >60 mL/min   GFR calc Af  Amer >60 >60 mL/min   Anion gap 7 5 - 15      Assessment & Plan:   Problem List Items Addressed This Visit      Other    Polyarthralgia - Primary    Likely osteoarthritis based on response to prednisone.  Swelling is localized to arch of feet and no swelling at or above ankle.  Tenderness with palpation to metatarsals.  Likely osteoarthritis with possible plantar fasciitis. Will order xr of bilateral feet today and provided referral to orthopedics.  Pain has not been managed with oral ibuprofen, will defer to orthopedics for recommendations.  Plan: 1. XR of left and right complete foot ordered 2. Referral to orthopedics 3. Can take ibuprofen 600-800mg  every 6-8 hours as needed for discomfort 4. Can wear compression stockings to help reduce foot swelling      Relevant Orders   DG Foot Complete Right   DG Foot Complete Left   AMB referral to orthopedics      No orders of the defined types were placed in this encounter.   Follow up plan: Return in about 3 months (around 02/17/2020) for CPE.   Charlaine Dalton, FNP Family Nurse Practitioner [facility] 11/17/2019, 2:29 PM

## 2019-11-17 NOTE — Telephone Encounter (Signed)
Let's plan to keep the appointment for today at 1:20 to re-evaluate his symptoms

## 2019-11-17 NOTE — Assessment & Plan Note (Addendum)
Likely osteoarthritis based on response to prednisone.  Swelling is localized to arch of feet and no swelling at or above ankle.  Tenderness with palpation to metatarsals.  Likely osteoarthritis with possible plantar fasciitis. Will order xr of bilateral feet today and provided referral to orthopedics.  Pain has not been managed with oral ibuprofen, will defer to orthopedics for recommendations.  Plan: 1. XR of left and right complete foot ordered 2. Referral to orthopedics 3. Can take ibuprofen 600-800mg  every 6-8 hours as needed for discomfort 4. Can wear compression stockings to help reduce foot swelling

## 2019-11-17 NOTE — Telephone Encounter (Signed)
The pt was notified of the provider  recommendation. He verbalize understanding. 

## 2019-11-17 NOTE — Telephone Encounter (Signed)
Pt states he called 3 times yesterday to check on this message.  Pt states he cannot work with his feet pain, and was hoping Joni Reining could call something else in. Pt has made appt for 1:20 pm today, and will cancel if he needs to.

## 2019-11-17 NOTE — Patient Instructions (Signed)
As we discussed, likely arthritis that is causing the bilateral foot pain with swelling and why it was responsive to prednisone.  We have taken xrays of both of your feet today and will contact you once we receive the over-read from the radiologist.  A referral to Orthopedics has been placed today.  If you have not heard from the specialty office or our referral coordinator within 1 week, please let us know and we will follow up with the referral coordinator for an update.  If you wanted to proceed to Orthopedic Urgent Care before you hear from the referral coordinator, there is an urgent care in [place].  [facility] is at [address], [place], [place] and their urgent care is open Monday-Friday from 2pm-7:30pm.  We will plan to see you back in 3 months for your physical  You will receive a survey after today's visit either digitally by e-mail or paper by [employer]. Your experiences and feedback matter to [place].  Please respond so we know how we are doing as we provide care for you.  Call us with any questions/concerns/needs.  It is my goal to be available to you for your health concerns.  Thanks for choosing me to be a partner in your healthcare needs!  Charlaine Dalton, FNP-C Family Nurse Practitioner [facility] Phone: [phone]

## 2019-11-21 DIAGNOSIS — M2142 Flat foot [pes planus] (acquired), left foot: Secondary | ICD-10-CM | POA: Diagnosis not present

## 2019-11-21 DIAGNOSIS — M2141 Flat foot [pes planus] (acquired), right foot: Secondary | ICD-10-CM | POA: Diagnosis not present

## 2019-11-23 DIAGNOSIS — L732 Hidradenitis suppurativa: Secondary | ICD-10-CM | POA: Diagnosis not present

## 2019-11-28 DIAGNOSIS — L732 Hidradenitis suppurativa: Secondary | ICD-10-CM | POA: Diagnosis not present

## 2019-11-29 DIAGNOSIS — L732 Hidradenitis suppurativa: Secondary | ICD-10-CM | POA: Diagnosis not present

## 2019-12-26 DIAGNOSIS — L732 Hidradenitis suppurativa: Secondary | ICD-10-CM | POA: Diagnosis not present

## 2019-12-29 DIAGNOSIS — L732 Hidradenitis suppurativa: Secondary | ICD-10-CM | POA: Diagnosis not present

## 2020-01-04 DIAGNOSIS — Z6821 Body mass index (BMI) 21.0-21.9, adult: Secondary | ICD-10-CM | POA: Diagnosis not present

## 2020-01-04 DIAGNOSIS — L732 Hidradenitis suppurativa: Secondary | ICD-10-CM | POA: Diagnosis not present

## 2020-01-26 DIAGNOSIS — L732 Hidradenitis suppurativa: Secondary | ICD-10-CM | POA: Diagnosis not present

## 2020-02-06 DIAGNOSIS — L732 Hidradenitis suppurativa: Secondary | ICD-10-CM | POA: Diagnosis not present

## 2020-03-04 DIAGNOSIS — L732 Hidradenitis suppurativa: Secondary | ICD-10-CM | POA: Diagnosis not present

## 2020-03-07 DIAGNOSIS — L732 Hidradenitis suppurativa: Secondary | ICD-10-CM | POA: Diagnosis not present

## 2020-04-09 DIAGNOSIS — L732 Hidradenitis suppurativa: Secondary | ICD-10-CM | POA: Diagnosis not present

## 2020-04-10 DIAGNOSIS — L732 Hidradenitis suppurativa: Secondary | ICD-10-CM | POA: Diagnosis not present

## 2020-05-14 DIAGNOSIS — L732 Hidradenitis suppurativa: Secondary | ICD-10-CM | POA: Diagnosis not present

## 2020-05-16 DIAGNOSIS — L732 Hidradenitis suppurativa: Secondary | ICD-10-CM | POA: Diagnosis not present

## 2020-06-18 DIAGNOSIS — L732 Hidradenitis suppurativa: Secondary | ICD-10-CM | POA: Diagnosis not present

## 2020-06-25 DIAGNOSIS — L732 Hidradenitis suppurativa: Secondary | ICD-10-CM | POA: Diagnosis not present

## 2020-07-23 DIAGNOSIS — L732 Hidradenitis suppurativa: Secondary | ICD-10-CM | POA: Diagnosis not present

## 2020-07-25 DIAGNOSIS — L732 Hidradenitis suppurativa: Secondary | ICD-10-CM | POA: Diagnosis not present

## 2020-09-27 ENCOUNTER — Emergency Department: Payer: Self-pay

## 2020-09-27 ENCOUNTER — Emergency Department
Admission: EM | Admit: 2020-09-27 | Discharge: 2020-09-27 | Disposition: A | Discharge status: Home / Self Care | Payer: Self-pay | Attending: Emergency Medicine | Admitting: Emergency Medicine

## 2020-09-27 ENCOUNTER — Encounter: Payer: Self-pay | Admitting: Emergency Medicine

## 2020-09-27 ENCOUNTER — Other Ambulatory Visit: Payer: Self-pay

## 2020-09-27 DIAGNOSIS — R519 Headache, unspecified: Secondary | ICD-10-CM | POA: Insufficient documentation

## 2020-09-27 DIAGNOSIS — K529 Noninfective gastroenteritis and colitis, unspecified: Secondary | ICD-10-CM | POA: Insufficient documentation

## 2020-09-27 DIAGNOSIS — R079 Chest pain, unspecified: Secondary | ICD-10-CM | POA: Insufficient documentation

## 2020-09-27 DIAGNOSIS — Z20822 Contact with and (suspected) exposure to covid-19: Secondary | ICD-10-CM | POA: Insufficient documentation

## 2020-09-27 DIAGNOSIS — L03317 Cellulitis of buttock: Secondary | ICD-10-CM | POA: Insufficient documentation

## 2020-09-27 DIAGNOSIS — E860 Dehydration: Secondary | ICD-10-CM | POA: Insufficient documentation

## 2020-09-27 DIAGNOSIS — M791 Myalgia, unspecified site: Secondary | ICD-10-CM | POA: Insufficient documentation

## 2020-09-27 DIAGNOSIS — E876 Hypokalemia: Secondary | ICD-10-CM | POA: Insufficient documentation

## 2020-09-27 DIAGNOSIS — F17210 Nicotine dependence, cigarettes, uncomplicated: Secondary | ICD-10-CM | POA: Insufficient documentation

## 2020-09-27 LAB — CBC
HCT: 36.3 % — ABNORMAL LOW (ref 39.0–52.0)
Hemoglobin: 12.4 g/dL — ABNORMAL LOW (ref 13.0–17.0)
MCH: 26.4 pg (ref 26.0–34.0)
MCHC: 34.2 g/dL (ref 30.0–36.0)
MCV: 77.2 fL — ABNORMAL LOW (ref 80.0–100.0)
Platelets: 322 10*3/uL (ref 150–400)
RBC: 4.7 MIL/uL (ref 4.22–5.81)
RDW: 17.3 % — ABNORMAL HIGH (ref 11.5–15.5)
WBC: 15.1 10*3/uL — ABNORMAL HIGH (ref 4.0–10.5)
nRBC: 0 % (ref 0.0–0.2)

## 2020-09-27 LAB — URINALYSIS, COMPLETE (UACMP) WITH MICROSCOPIC
Bacteria, UA: NONE SEEN
Bilirubin Urine: NEGATIVE
Glucose, UA: NEGATIVE mg/dL
Ketones, ur: NEGATIVE mg/dL
Leukocytes,Ua: NEGATIVE
Nitrite: NEGATIVE
Protein, ur: NEGATIVE mg/dL
Specific Gravity, Urine: 1.003 — ABNORMAL LOW (ref 1.005–1.030)
pH: 6 (ref 5.0–8.0)

## 2020-09-27 LAB — PROTIME-INR
INR: 1.4 — ABNORMAL HIGH (ref 0.8–1.2)
Prothrombin Time: 16.7 seconds — ABNORMAL HIGH (ref 11.4–15.2)

## 2020-09-27 LAB — BASIC METABOLIC PANEL
Anion gap: 10 (ref 5–15)
BUN: 6 mg/dL (ref 6–20)
CO2: 26 mmol/L (ref 22–32)
Calcium: 8.3 mg/dL — ABNORMAL LOW (ref 8.9–10.3)
Chloride: 96 mmol/L — ABNORMAL LOW (ref 98–111)
Creatinine, Ser: 1.03 mg/dL (ref 0.61–1.24)
GFR, Estimated: >60 mL/min (ref >60)
Glucose, Bld: 120 mg/dL — ABNORMAL HIGH (ref 70–99)
Potassium: 2.9 mmol/L — ABNORMAL LOW (ref 3.5–5.1)
Sodium: 132 mmol/L — ABNORMAL LOW (ref 135–145)

## 2020-09-27 LAB — HEPATIC FUNCTION PANEL
ALT: 10 U/L (ref 0–44)
AST: 22 U/L (ref 15–41)
Albumin: 2.6 g/dL — ABNORMAL LOW (ref 3.5–5.0)
Alkaline Phosphatase: 154 U/L — ABNORMAL HIGH (ref 38–126)
Bilirubin, Direct: <0.1 mg/dL (ref 0.0–0.2)
Total Bilirubin: 0.5 mg/dL (ref 0.3–1.2)
Total Protein: 8.5 g/dL — ABNORMAL HIGH (ref 6.5–8.1)

## 2020-09-27 LAB — LIPASE, BLOOD: Lipase: 38 U/L (ref 11–51)

## 2020-09-27 LAB — MAGNESIUM: Magnesium: 1.4 mg/dL — ABNORMAL LOW (ref 1.7–2.4)

## 2020-09-27 LAB — RESP PANEL BY RT-PCR (FLU A&B, COVID) ARPGX2
Influenza A by PCR: NEGATIVE
Influenza B by PCR: NEGATIVE
SARS Coronavirus 2 by RT PCR: NEGATIVE

## 2020-09-27 LAB — PROCALCITONIN: Procalcitonin: 0.78 ng/mL

## 2020-09-27 LAB — APTT: aPTT: 37 seconds — ABNORMAL HIGH (ref 24–36)

## 2020-09-27 LAB — LACTIC ACID, PLASMA: Lactic Acid, Venous: 0.9 mmol/L (ref 0.5–1.9)

## 2020-09-27 MED ORDER — DIPHENHYDRAMINE HCL 50 MG/ML IJ SOLN
25.0000 mg | Freq: Once | INTRAMUSCULAR | Status: AC
  Administered 2020-09-27: 25 mg via INTRAVENOUS
  Filled 2020-09-27: qty 1

## 2020-09-27 MED ORDER — VANCOMYCIN HCL 1500 MG/300ML IV SOLN
1500.0000 mg | Freq: Once | INTRAVENOUS | Status: AC
  Administered 2020-09-27: 1500 mg via INTRAVENOUS
  Filled 2020-09-27: qty 300

## 2020-09-27 MED ORDER — POTASSIUM CHLORIDE CRYS ER 20 MEQ PO TBCR
80.0000 meq | EXTENDED_RELEASE_TABLET | Freq: Once | ORAL | Status: AC
  Administered 2020-09-27: 80 meq via ORAL
  Filled 2020-09-27: qty 4

## 2020-09-27 MED ORDER — MAGNESIUM SULFATE 2 GM/50ML IV SOLN
2.0000 g | Freq: Once | INTRAVENOUS | Status: AC
  Administered 2020-09-27: 2 g via INTRAVENOUS
  Filled 2020-09-27: qty 50

## 2020-09-27 MED ORDER — LACTATED RINGERS IV BOLUS
1000.0000 mL | Freq: Once | INTRAVENOUS | Status: AC
  Administered 2020-09-27: 1000 mL via INTRAVENOUS

## 2020-09-27 MED ORDER — ONDANSETRON HCL 4 MG PO TABS: 4.0000 mg | ORAL_TABLET | Freq: Three times a day (TID) | ORAL | 0 refills | Status: DC | PRN

## 2020-09-27 MED ORDER — PROCHLORPERAZINE EDISYLATE 10 MG/2ML IJ SOLN
10.0000 mg | Freq: Once | INTRAMUSCULAR | Status: AC
  Administered 2020-09-27: 10 mg via INTRAVENOUS
  Filled 2020-09-27: qty 2

## 2020-09-27 MED ORDER — DOXYCYCLINE HYCLATE 100 MG PO CAPS: 100.0000 mg | ORAL_CAPSULE | Freq: Two times a day (BID) | ORAL | 0 refills | Status: AC

## 2020-09-27 NOTE — ED Triage Notes (Signed)
Pt comes into the ED via POV c/o weakness, body aches, and headache.  Pt denies being around anyone with COVID that he is aware of.  Pt ambulatory to triage with NAD.  Pt has even and unlabored respirations.  Pt also admits to some nausea.  Pt denies any h/o migraines.

## 2020-09-27 NOTE — ED Provider Notes (Signed)
[facility] Emergency Department Provider Note  ____________________________________________   Event Date/Time   First MD Initiated Contact with Patient 09/27/20 1126     (approximate)  I have reviewed the triage vital signs and the nursing notes.   HISTORY  Chief Complaint Weakness, Headache, and Generalized Body Aches   HPI Benjamin Owens is a 50 y.o. male past medical history of hidradenitis previously on Remicade although states he has not been taking his due to insurance issues who presents for assessment approximately 2 days of headache and body aches associate with nonbloody nonbilious vomiting and diarrhea.  He denies any congestion, earache, chest pain, cough, shortness of breath, abdominal pain, back pain, pain with urination or any blood in his vomit or stool.  He denies any rashes or focal extremity weakness numbness or tingling.  No recent falls or injuries.  States he has not been a regular drinker at least couple weeks but was before that.  Denies any illicit drug use.  Denies any other acute concerns at this time.         Past Medical History:  Diagnosis Date   Hay fever    Hidradenitis suppurativa     Patient Active Problem List   Diagnosis Date Noted   Polyarthralgia 11/10/2019   Abscess of scrotal wall 09/13/2019   Alcohol dependence (HCC) 09/13/2019   Chest pain 03/29/2018   Nicotine dependence, uncomplicated 11/05/2015   Hidradenitis suppurativa 01/17/2013    Past Surgical History:  Procedure Laterality Date   INCISION AND DRAINAGE ABSCESS Right 09/13/2019   Procedure: INCISION AND DRAINAGE ABSCESS;  Surgeon: Riki Altes, MD;  Location: [facility] ORS;  Service: Urology;  Laterality: Right;   NO PAST SURGERIES      Prior to Admission medications   Medication Sig Start Date End Date Taking? Authorizing Provider  doxycycline (VIBRAMYCIN) 100 MG capsule Take 1 capsule (100 mg total) by mouth 2 (two) times daily for 14 days.  09/27/20 10/11/20 Yes Gilles Chiquito, MD  ondansetron (ZOFRAN) 4 MG tablet Take 1 tablet (4 mg total) by mouth every 8 (eight) hours as needed for up to 10 doses for nausea or vomiting. 09/27/20  Yes Gilles Chiquito, MD  folic acid (FOLVITE) 1 MG tablet Take 1 tablet (1 mg total) by mouth daily. Patient not taking: Reported on 11/17/2019 09/16/19   Lynn Ito, MD  ibuprofen (ADVIL) 200 MG tablet Take by mouth.    [provider]  inFLIXimab (REMICADE) 100 MG injection Inject into the vein. Patient not taking: Reported on 11/10/2019    [provider]  oxyCODONE-acetaminophen (PERCOCET) 5-325 MG tablet Take 1 tablet by mouth every 6 (six) hours as needed for severe pain. Patient not taking: Reported on 11/10/2019 10/06/19   Bridget Hartshorn L, PA-C  potassium chloride (K-DUR) 10 MEQ tablet Take 1 tablet (10 mEq total) by mouth 2 (two) times daily for 7 days. 04/20/18 04/27/18  Dionne Bucy, MD  thiamine 100 MG tablet Take 1 tablet (100 mg total) by mouth daily. Patient not taking: Reported on 11/10/2019 09/16/19   Lynn Ito, MD    Allergies Vancomycin and Azithromycin  Family History  Problem Relation Age of Onset   Stroke Mother    Heart disease Mother        Degenerative heart disease   Diabetes Mother    Throat cancer Father    Lung cancer Maternal Aunt    Breast cancer Paternal Aunt    Lung cancer Paternal Uncle  smoker   Healthy Maternal Grandmother    Heart attack Maternal Grandfather    Breast cancer Paternal Grandmother    Heart attack Paternal Grandfather    Healthy Cousin    Prostate cancer Paternal Uncle    Breast cancer Maternal Aunt     Social History Social History   Tobacco Use   Smoking status: Every Day    Packs/day: 0.25    Pack years: 0.00    Types: Cigarettes   Smokeless tobacco: Never  Vaping Use   Vaping Use: Never used  Substance Use Topics   Alcohol use: Not Currently    Alcohol/week: 3.0 standard drinks    Types: 3 Cans  of beer per week    Comment: weekdays 3 x 16 oz beers; Weekend approx 2 cases over Sat/Sun.   Drug use: Yes    Types: Marijuana    Review of Systems  Review of Systems  Constitutional:  Positive for chills, fever and malaise/fatigue.  HENT:  Negative for sore throat.   Eyes:  Negative for pain.  Respiratory:  Negative for cough and stridor.   Cardiovascular:  Negative for chest pain.  Gastrointestinal:  Positive for diarrhea, nausea and vomiting.  Genitourinary:  Negative for dysuria.  Musculoskeletal:  Positive for myalgias.  Skin:  Negative for rash.  Neurological:  Positive for headaches. Negative for seizures and loss of consciousness.  Psychiatric/Behavioral:  Negative for suicidal ideas.   All other systems reviewed and are negative.    ____________________________________________   PHYSICAL EXAM:  VITAL SIGNS: ED Triage Vitals  Enc Vitals Group     BP 09/27/20 1119 120/84     Pulse Rate 09/27/20 1119 (!) 120     Resp 09/27/20 1119 18     Temp 09/27/20 1119 98 F (36.7 C)     Temp Source 09/27/20 1119 Oral     SpO2 09/27/20 1119 97 %     Weight 09/27/20 1117 158 lb 4.6 oz (71.8 kg)     Height 09/27/20 1117 6' (1.829 m)     Head Circumference --      Peak Flow --      Pain Score 09/27/20 1116 8     Pain Loc --      Pain Edu? --      Excl. in GC? --    Vitals:   09/27/20 1230 09/27/20 1329  BP: 108/73 95/64  Pulse:  86  Resp: 15 18  Temp:    SpO2:  100%   Physical Exam Vitals and nursing note reviewed.  Constitutional:      Appearance: He is well-developed.  HENT:     Head: Normocephalic and atraumatic.     Right Ear: External ear normal.     Left Ear: External ear normal.     Nose: Nose normal.     Mouth/Throat:     Mouth: Mucous membranes are dry.  Eyes:     Conjunctiva/sclera: Conjunctivae normal.  Cardiovascular:     Rate and Rhythm: Normal rate and regular rhythm.     Heart sounds: No murmur heard. Pulmonary:     Effort: Pulmonary  effort is normal. No respiratory distress.     Breath sounds: Normal breath sounds.  Abdominal:     Palpations: Abdomen is soft.     Tenderness: There is no abdominal tenderness. There is no right CVA tenderness or left CVA tenderness.  Musculoskeletal:     Cervical back: Neck supple.  Skin:    General: Skin is  warm and dry.     Capillary Refill: Capillary refill takes more than 3 seconds.  Neurological:     Mental Status: He is alert and oriented to person, place, and time.  Psychiatric:        Mood and Affect: Mood normal.    Cranial nerves II through XII grossly intact.  No pronator drift.  No finger dysmetria.  Symmetric 5/5 strength of all extremities.  Sensation intact to light touch in all extremities.  Unremarkable unassisted gait.  Bilateral lower tissue fairly indurated and erythematous scattered areas of skin breakdown drainage.  Patient states he thinks this is chronic and does not have any new pain in this area.  No clear areas normal to I&D at this time.  ____________________________________________   LABS (all labs ordered are listed, but only abnormal results are displayed)  Labs Reviewed  BASIC METABOLIC PANEL - Abnormal; Notable for the following components:      Result Value   Sodium 132 (*)    Potassium 2.9 (*)    Chloride 96 (*)    Glucose, Bld 120 (*)    Calcium 8.3 (*)    All other components within normal limits  CBC - Abnormal; Notable for the following components:   WBC 15.1 (*)    Hemoglobin 12.4 (*)    HCT 36.3 (*)    MCV 77.2 (*)    RDW 17.3 (*)    All other components within normal limits  URINALYSIS, COMPLETE (UACMP) WITH MICROSCOPIC - Abnormal; Notable for the following components:   Color, Urine YELLOW (*)    APPearance CLEAR (*)    Specific Gravity, Urine 1.003 (*)    Hgb urine dipstick SMALL (*)    All other components within normal limits  HEPATIC FUNCTION PANEL - Abnormal; Notable for the following components:   Total Protein 8.5 (*)     Albumin 2.6 (*)    Alkaline Phosphatase 154 (*)    All other components within normal limits  PROTIME-INR - Abnormal; Notable for the following components:   Prothrombin Time 16.7 (*)    INR 1.4 (*)    All other components within normal limits  APTT - Abnormal; Notable for the following components:   aPTT 37 (*)    All other components within normal limits  MAGNESIUM - Abnormal; Notable for the following components:   Magnesium 1.4 (*)    All other components within normal limits  RESP PANEL BY RT-PCR (FLU A&B, COVID) ARPGX2  CULTURE, BLOOD (SINGLE)  LACTIC ACID, PLASMA  LIPASE, BLOOD  PROCALCITONIN   ____________________________________________  EKG  Sinus tachycardia with ventricular to 120, normal axis, unremarkable intervals without clearance of acute ischemia or significant arrhythmia. ____________________________________________  RADIOLOGY  ED MD interpretation: CT head shows no evidence of abscess, mass-effect, or other clear acute intracranial process.  Official radiology report(s): CT Head Wo Contrast  Result Date: 09/27/2020 CLINICAL DATA:  Headache. EXAM: CT HEAD WITHOUT CONTRAST TECHNIQUE: Contiguous axial images were obtained from the base of the skull through the vertex without intravenous contrast. COMPARISON:  None. FINDINGS: Brain: There is no evidence of an acute infarct, intracranial hemorrhage, mass, midline shift, or extra-axial fluid collection. The ventricles and sulci are within normal limits for age. Vascular: Calcified atherosclerosis at the skull base. No hyperdense vessel. Skull: No fracture or suspicious osseous lesion. Sinuses/Orbits: Visualized paranasal sinuses and mastoid air cells are clear. Unremarkable orbits. Other: None. IMPRESSION: Unremarkable CT appearance of the brain. Electronically Signed   By: Freida BusmanAllen  Mosetta Putt M.D.   On: 09/27/2020 13:36    ____________________________________________   PROCEDURES  Procedure(s) performed (including  Critical Care):  .1-3 Lead EKG Interpretation  Date/Time: 09/27/2020 3:28 PM Performed by: Gilles Chiquito, MD Authorized by: Gilles Chiquito, MD     Interpretation: normal     ECG rate assessment: normal     Rhythm: sinus rhythm     Ectopy: none     Conduction: normal     ____________________________________________   INITIAL IMPRESSION / ASSESSMENT AND PLAN / ED COURSE      Patient presents with above-stated history exam for assessment approximately 2 days of headache and myalgias associated nonbloody emesis vomiting.  On arrival patient is slight tachycardic with otherwise stable vital signs on room air.  His lungs are clear bilaterally his abdomen is soft.  There is no evidence on exam of deep space infection of the neck i.e. Ludewig's angina, mastoiditis, peritonsillar abscess, retropharyngeal abscess and has no stiffness or limitation of range of motion suggestive of meningitis.  Primary differential occludes acute viral process with complaints of gastroenteritis.  Patient also appears dehydrated.  He has no fever respiratory symptoms or abnormal findings on lung exam to suggest acute bacterial pneumonia at this time.  No tenderness in the right upper quadrant or evidence on Function panel of heparin Titus or cholestasis to suggest cholecystitis.  Lipase not consistent with acute pancreatitis.  Patient denies any urinary symptoms to suggest cystitis and has no findings that would suggest pyelonephritis at this time.  In addition Evalose patient for diverticulitis or appendicitis at this time given absence of any abdominal pain or tenderness on exam.  CT head is unremarkable on repeat exam patient continues to have no neck stiffness.  Suspect headache possibly related to dehydration and metabolic derangements and I have a lower suspicion for meningitis or other deep space infection of the head or neck at this time.  CBC does show leukocytosis with WBC count of 15.1 with stable  hemoglobin and platelets.  COVID and influenza PCR is negative.  BMP shows a K of 2.9 without other significant electrolyte or metabolic derangements.  Lactic is nonelevated at 0.9.  Magnesium is low at 1.4.  Patient does have evidence on exam of cellulitis sacral area without abscess clear minimal drainage.  Patient states this is fairly chronic but somewhat difficult to exclude an acute cellulitis based on exam and given elevated white blood cell count and tachycardia on arrival we will give patient a dose of vancomycin in the emergency room and treat with a course of doxycycline with plan for close outpatient PCP follow-up for wound check.  Unclear if this would be related to his GI or headache symptoms.  UA is not suggestive of cystitis.  Procalcitonin is slightly elevated.  On reassessment he has no tachycardia is hemodynamically stable and states his headache and nausea is much improved.  He has been able tolerate p.o.  Given otherwise reassuring exam provement in heart rate patient's and felt better and I think discharge is safe at this time with plan for close outpatient PCP follow-up and wound recheck cellulitis is buttocks this coming Monday.  Advised and return immediately to emergency room if he experiences any neck stiffness, significant fevers, worsening pain or is unable to keep fluids down.  He is amenable to this plan.  Rx written for Doxy and Zofran.  Discharged stable condition.  Strict return precautions advised and discussed.      ____________________________________________   FINAL  CLINICAL IMPRESSION(S) / ED DIAGNOSES  Final diagnoses:  Cellulitis of buttock  Hypomagnesemia  Hypokalemia  Gastroenteritis  Dehydration    Medications  vancomycin (VANCOREADY) IVPB 1500 mg/300 mL (1,500 mg Intravenous New Bag/Given 09/27/20 1447)  prochlorperazine (COMPAZINE) injection 10 mg (10 mg Intravenous Given 09/27/20 1230)  lactated ringers bolus 1,000 mL (0 mLs Intravenous Stopped  09/27/20 1325)  magnesium sulfate IVPB 2 g 50 mL (0 g Intravenous Stopped 09/27/20 1325)  potassium chloride SA (KLOR-CON) CR tablet 80 mEq (80 mEq Oral Given 09/27/20 1325)  magnesium sulfate IVPB 2 g 50 mL (0 g Intravenous Stopped 09/27/20 1440)  diphenhydrAMINE (BENADRYL) injection 25 mg (25 mg Intravenous Given 09/27/20 1448)     ED Discharge Orders          Ordered    ondansetron (ZOFRAN) 4 MG tablet  Every 8 hours PRN        09/27/20 1527    doxycycline (VIBRAMYCIN) 100 MG capsule  2 times daily        09/27/20 1527             Note:  This document was prepared using Dragon voice recognition software and may include unintentional dictation errors.    Gilles Chiquito, MD 09/27/20 [phone]

## 2020-09-27 NOTE — Consult Note (Signed)
PHARMACY -  BRIEF ANTIBIOTIC NOTE   Pharmacy has received consult(s) for vancomycin from an ED provider.  The patient's profile has been reviewed for ht/wt/allergies/indication/available labs.    One time order(s) placed for: Vancomycin 1.5g x1  (pt has a history of mild vancomycin allergy "Itching at IV site and redness (pt tolerated with IV benadryl)" After d/w MD: Ordered IV benadryl x1; to give prior to Vanc dose.  Further antibiotics/pharmacy consults should be ordered by admitting physician if indicated.                       Thank you, Martyn Malay 09/27/2020  2:11 PM

## 2020-10-02 LAB — CULTURE, BLOOD (SINGLE)
Culture: NO GROWTH
Special Requests: ADEQUATE

## 2020-10-22 ENCOUNTER — Encounter: Payer: Self-pay | Admitting: Radiology

## 2020-10-22 ENCOUNTER — Other Ambulatory Visit: Payer: Self-pay

## 2020-10-22 ENCOUNTER — Emergency Department: Payer: Self-pay

## 2020-10-22 ENCOUNTER — Emergency Department
Admission: EM | Admit: 2020-10-22 | Discharge: 2020-10-22 | Disposition: A | Discharge status: Home / Self Care | Payer: Self-pay | Attending: Emergency Medicine | Admitting: Emergency Medicine

## 2020-10-22 DIAGNOSIS — L0231 Cutaneous abscess of buttock: Secondary | ICD-10-CM | POA: Diagnosis present

## 2020-10-22 DIAGNOSIS — F17210 Nicotine dependence, cigarettes, uncomplicated: Secondary | ICD-10-CM | POA: Diagnosis not present

## 2020-10-22 DIAGNOSIS — L732 Hidradenitis suppurativa: Secondary | ICD-10-CM | POA: Diagnosis not present

## 2020-10-22 LAB — COMPREHENSIVE METABOLIC PANEL
ALT: 13 U/L (ref 0–44)
AST: 20 U/L (ref 15–41)
Albumin: 2.6 g/dL — ABNORMAL LOW (ref 3.5–5.0)
Alkaline Phosphatase: 169 U/L — ABNORMAL HIGH (ref 38–126)
Anion gap: 9 (ref 5–15)
BUN: 6 mg/dL (ref 6–20)
CO2: 25 mmol/L (ref 22–32)
Calcium: 8.3 mg/dL — ABNORMAL LOW (ref 8.9–10.3)
Chloride: 102 mmol/L (ref 98–111)
Creatinine, Ser: 0.98 mg/dL (ref 0.61–1.24)
GFR, Estimated: >60 mL/min (ref >60)
Glucose, Bld: 148 mg/dL — ABNORMAL HIGH (ref 70–99)
Potassium: 3.3 mmol/L — ABNORMAL LOW (ref 3.5–5.1)
Sodium: 136 mmol/L (ref 135–145)
Total Bilirubin: 0.5 mg/dL (ref 0.3–1.2)
Total Protein: 8.3 g/dL — ABNORMAL HIGH (ref 6.5–8.1)

## 2020-10-22 LAB — CBC WITH DIFFERENTIAL/PLATELET
Abs Immature Granulocytes: 0.06 10*3/uL (ref 0.00–0.07)
Basophils Absolute: 0 10*3/uL (ref 0.0–0.1)
Basophils Relative: 0 %
Eosinophils Absolute: 0.1 10*3/uL (ref 0.0–0.5)
Eosinophils Relative: 1 %
HCT: 31.5 % — ABNORMAL LOW (ref 39.0–52.0)
Hemoglobin: 10.7 g/dL — ABNORMAL LOW (ref 13.0–17.0)
Immature Granulocytes: 0 %
Lymphocytes Relative: 17 %
Lymphs Abs: 2.3 10*3/uL (ref 0.7–4.0)
MCH: 27 pg (ref 26.0–34.0)
MCHC: 34 g/dL (ref 30.0–36.0)
MCV: 79.3 fL — ABNORMAL LOW (ref 80.0–100.0)
Monocytes Absolute: 1 10*3/uL (ref 0.1–1.0)
Monocytes Relative: 7 %
Neutro Abs: 10.3 10*3/uL — ABNORMAL HIGH (ref 1.7–7.7)
Neutrophils Relative %: 75 %
Platelets: 399 10*3/uL (ref 150–400)
RBC: 3.97 MIL/uL — ABNORMAL LOW (ref 4.22–5.81)
RDW: 17.3 % — ABNORMAL HIGH (ref 11.5–15.5)
WBC: 13.8 10*3/uL — ABNORMAL HIGH (ref 4.0–10.5)
nRBC: 0 % (ref 0.0–0.2)

## 2020-10-22 MED ORDER — METRONIDAZOLE 500 MG/100ML IV SOLN
500.0000 mg | Freq: Once | INTRAVENOUS | Status: AC
  Administered 2020-10-22: 500 mg via INTRAVENOUS
  Filled 2020-10-22: qty 100

## 2020-10-22 MED ORDER — IOHEXOL 350 MG/ML SOLN
75.0000 mL | Freq: Once | INTRAVENOUS | Status: AC | PRN
  Administered 2020-10-22: 75 mL via INTRAVENOUS
  Filled 2020-10-22: qty 75

## 2020-10-22 MED ORDER — POTASSIUM CHLORIDE CRYS ER 20 MEQ PO TBCR: 40.0000 meq | EXTENDED_RELEASE_TABLET | Freq: Once | ORAL | Status: DC

## 2020-10-22 MED ORDER — METRONIDAZOLE 500 MG PO TABS: 500.0000 mg | ORAL_TABLET | Freq: Three times a day (TID) | ORAL | 0 refills | Status: AC

## 2020-10-22 MED ORDER — CEFDINIR 300 MG PO CAPS: 300.0000 mg | ORAL_CAPSULE | Freq: Two times a day (BID) | ORAL | 0 refills | Status: AC

## 2020-10-22 MED ORDER — OXYCODONE HCL 5 MG PO TABS: 5.0000 mg | ORAL_TABLET | Freq: Three times a day (TID) | ORAL | 0 refills | Status: AC | PRN

## 2020-10-22 MED ORDER — SODIUM CHLORIDE 0.9 % IV SOLN
1.0000 g | Freq: Once | INTRAVENOUS | Status: AC
  Administered 2020-10-22: 1 g via INTRAVENOUS
  Filled 2020-10-22: qty 10

## 2020-10-22 NOTE — ED Provider Notes (Addendum)
[facility] Emergency Department Provider Note  ____________________________________________   Event Date/Time   First MD Initiated Contact with Patient 10/22/20 1413     (approximate)  I have reviewed the triage vital signs and the nursing notes.   HISTORY  Chief Complaint Abscess    HPI Benjamin Owens is a 50 y.o. male with high tinnitus previously on Remicade but over the past 3 months due to insurance issues who comes in with concern for worsening in abscesses of his buttock and groin area.  Patient reports over the past few days he is getting worse.  Multiple abscesses with drainage noted.  Very indurated feeling.  He states that he is having pain that is constant, worse with sitting, better at rest.  Denies any fever states that it can get this bad previously and typically is put on Flagyl and cefdinir.  He has required I&D's in the past.  On review of records patient is followed at [facility].           Past Medical History:  Diagnosis Date   Hay fever    Hidradenitis suppurativa     Patient Active Problem List   Diagnosis Date Noted   Polyarthralgia 11/10/2019   Abscess of scrotal wall 09/13/2019   Alcohol dependence (HCC) 09/13/2019   Chest pain 03/29/2018   Nicotine dependence, uncomplicated 11/05/2015   Hidradenitis suppurativa 01/17/2013    Past Surgical History:  Procedure Laterality Date   INCISION AND DRAINAGE ABSCESS Right 09/13/2019   Procedure: INCISION AND DRAINAGE ABSCESS;  Surgeon: Riki Altes, MD;  Location: [facility] ORS;  Service: Urology;  Laterality: Right;   NO PAST SURGERIES      Prior to Admission medications   Medication Sig Start Date End Date Taking? Authorizing Provider  folic acid (FOLVITE) 1 MG tablet Take 1 tablet (1 mg total) by mouth daily. Patient not taking: Reported on 11/17/2019 09/16/19   Lynn Ito, MD  ibuprofen (ADVIL) 200 MG tablet Take by mouth.    [provider]  inFLIXimab  (REMICADE) 100 MG injection Inject into the vein. Patient not taking: Reported on 11/10/2019    [provider]  ondansetron (ZOFRAN) 4 MG tablet Take 1 tablet (4 mg total) by mouth every 8 (eight) hours as needed for up to 10 doses for nausea or vomiting. 09/27/20   Gilles Chiquito, MD  oxyCODONE-acetaminophen (PERCOCET) 5-325 MG tablet Take 1 tablet by mouth every 6 (six) hours as needed for severe pain. Patient not taking: Reported on 11/10/2019 10/06/19   Bridget Hartshorn L, PA-C  potassium chloride (K-DUR) 10 MEQ tablet Take 1 tablet (10 mEq total) by mouth 2 (two) times daily for 7 days. 04/20/18 04/27/18  Dionne Bucy, MD  thiamine 100 MG tablet Take 1 tablet (100 mg total) by mouth daily. Patient not taking: Reported on 11/10/2019 09/16/19   Lynn Ito, MD    Allergies Vancomycin and Azithromycin  Family History  Problem Relation Age of Onset   Stroke Mother    Heart disease Mother        Degenerative heart disease   Diabetes Mother    Throat cancer Father    Lung cancer Maternal Aunt    Breast cancer Paternal Aunt    Lung cancer Paternal Uncle        smoker   Healthy Maternal Grandmother    Heart attack Maternal Grandfather    Breast cancer Paternal Grandmother    Heart attack Paternal Grandfather    Healthy  Cousin    Prostate cancer Paternal Uncle    Breast cancer Maternal Aunt     Social History Social History   Tobacco Use   Smoking status: Every Day    Packs/day: 0.25    Types: Cigarettes   Smokeless tobacco: Never  Vaping Use   Vaping Use: Never used  Substance Use Topics   Alcohol use: Not Currently    Alcohol/week: 3.0 standard drinks    Types: 3 Cans of beer per week    Comment: weekdays 3 x 16 oz beers; Weekend approx 2 cases over Sat/Sun.   Drug use: Yes    Types: Marijuana      Review of Systems Constitutional: No fever/chills Eyes: No visual changes. ENT: No sore throat. Cardiovascular: Denies chest pain. Respiratory: Denies  shortness of breath. Gastrointestinal: No abdominal pain.  No nausea, no vomiting.  No diarrhea.  No constipation. Genitourinary: Buttock lesions.. Musculoskeletal: Negative for back pain. Skin: Negative for rash. Neurological: Negative for headaches, focal weakness or numbness. All other ROS negative ____________________________________________   PHYSICAL EXAM:  VITAL SIGNS: ED Triage Vitals [10/22/20 1309]  Enc Vitals Group     BP 111/68     Pulse Rate 83     Resp 16     Temp 97.7 F (36.5 C)     Temp Source Oral     SpO2 99 %     Weight 170 lb (77.1 kg)     Height 6' (1.829 m)     Head Circumference      Peak Flow      Pain Score 6     Pain Loc      Pain Edu?      Excl. in GC?     Constitutional: Alert and oriented. Well appearing and in no acute distress. Eyes: Conjunctivae are normal. EOMI. Head: Atraumatic. Nose: No congestion/rhinnorhea. Mouth/Throat: Mucous membranes are moist.   Neck: No stridor. Trachea Midline. FROM Cardiovascular: Normal rate, regular rhythm. Grossly normal heart sounds.  Good peripheral circulation. Respiratory: Normal respiratory effort.  No retractions. Lungs CTAB. Gastrointestinal: Soft and nontender. No distention. No abdominal bruits.  Musculoskeletal: No lower extremity tenderness nor edema.  No joint effusions. Neurologic:  Normal speech and language. No gross focal neurologic deficits are appreciated.  Skin:  Skin is warm, dry and intact. No rash noted. Psychiatric: Mood and affect are normal. Speech and behavior are normal. GU: Exam was done with chaperone in room.  Multiple indurated lesions on his buttocks with some active drainage noted from multiple of them.  Very indurated feeling.similar indurated lesions from his hid. Suppurative noted in perioneum/groin region.  No necrotic tissue. No redness. Testicles are swollen but that is been constant for over a month per patient.  NO Crepitus  felt.  ____________________________________________   LABS (all labs ordered are listed, but only abnormal results are displayed)  Labs Reviewed  CBC WITH DIFFERENTIAL/PLATELET - Abnormal; Notable for the following components:      Result Value   WBC 13.8 (*)    RBC 3.97 (*)    Hemoglobin 10.7 (*)    HCT 31.5 (*)    MCV 79.3 (*)    RDW 17.3 (*)    Neutro Abs 10.3 (*)    All other components within normal limits  COMPREHENSIVE METABOLIC PANEL - Abnormal; Notable for the following components:   Potassium 3.3 (*)    Glucose, Bld 148 (*)    Calcium 8.3 (*)    Total Protein 8.3 (*)  Albumin 2.6 (*)    Alkaline Phosphatase 169 (*)    All other components within normal limits   ____________________________________________   RADIOLOGY   Official radiology report(s): CT ABDOMEN PELVIS W CONTRAST  Result Date: 10/22/2020 CLINICAL DATA:  Buttock and groin abscesses, worsening over the last 2 weeks. History of hidradenitis suppurativa EXAM: CT ABDOMEN AND PELVIS WITH CONTRAST TECHNIQUE: Multidetector CT imaging of the abdomen and pelvis was performed using the standard protocol following bolus administration of intravenous contrast. CONTRAST:  75mL OMNIPAQUE IOHEXOL 350 MG/ML SOLN COMPARISON:  CT 09/13/2019, 11/24/2017 FINDINGS: Lower chest: Included lung bases are clear. Heart size is within normal limits. Hepatobiliary: Unremarkable appearance of the liver. No focal liver lesion identified. Gallbladder is contracted. No biliary dilatation. Pancreas: Unremarkable. No pancreatic ductal dilatation or surrounding inflammatory changes. Spleen: Normal in size without focal abnormality. Adrenals/Urinary Tract: Subcentimeter right adrenal gland nodule is stable from 2019, benign. Left adrenal gland unremarkable. Normal appearance of the bilateral kidneys with symmetric enhancement. No focal renal lesion, stone, or hydronephrosis. Mild circumferential wall thickening of the urinary bladder.  Stomach/Bowel: Stomach is within normal limits. No evidence of bowel wall thickening, distention, or inflammatory changes. Vascular/Lymphatic: Scattered aortoiliac atherosclerotic calcifications without aneurysm. No abdominopelvic lymphadenopathy. Reproductive: Prostate is unremarkable. Prominent scrotal wall thickening and edema with large, likely complex bilateral hydroceles. No rim enhancing scrotal wall fluid collection. No gas is seen within the wall of the scrotum or perineum. Other: Markedly thickened subcutaneous soft tissues throughout the gluteal region bilaterally with lobulated fluid and air collections, slightly progressive in appearance compared to prior. There is also involvement of the soft tissues of the perineum. Reference thick walled collection within the medial right gluteal soft tissues measures approximately 3.4 x 1.5 x 2.9 cm (series 2, image 101). Small amount of free fluid within the dependent portion of the pelvis. No organized fluid collection within the abdomen or pelvis. No pneumoperitoneum. No abdominal wall hernia. Musculoskeletal: Chronic fatty atrophy of the bilateral gluteus maximus muscles. No acute osseous abnormality. IMPRESSION: 1. Markedly thickened subcutaneous soft tissues throughout the gluteal region bilaterally with numerous thick walled abscesses, slightly progressive in appearance compared to prior study. There is also involvement of the soft tissues of the perineum. Findings are in keeping with reported history of hidradenitis suppurativa. 2. Prominent scrotal wall thickening and edema with large complex-appearing bilateral hydroceles. No gas is seen within the wall of the scrotum or perineum to suggest Fournier's gangrene. 3. Mild circumferential wall thickening of the urinary bladder, which may represent cystitis. Correlate with urinalysis. 4. Small amount of free fluid within the dependent portion of the pelvis. No organized fluid collection within the abdomen or  pelvis. Aortic Atherosclerosis (ICD10-I70.0). Electronically Signed   By: Duanne Guess D.O.   On: 10/22/2020 16:07    ____________________________________________   PROCEDURES  Procedure(s) performed (including Critical Care):  Procedures   ____________________________________________   INITIAL IMPRESSION / ASSESSMENT AND PLAN / ED COURSE  Benjamin Owens was evaluated in Emergency Department on 10/22/2020 for the symptoms described in the history of present illness. He was evaluated in the context of the global COVID-19 pandemic, which necessitated consideration that the patient might be at risk for infection with the SARS-CoV-2 virus that causes COVID-19. Institutional protocols and algorithms that pertain to the evaluation of patients at risk for COVID-19 are in a state of rapid change based on information released by regulatory bodies including the CDC and federal and state organizations. These policies and algorithms were followed during  the patient's care in the ED.    Patient comes in with multiple wounds noted to his bottom.  No obvious scrotal abscess or crepitus felt but does have multiple drainage noted.  Will get CT scan to evaluate for how deep these abscesses are to make sure there is nothing that looks concerning for Fournier's gangrene/necrotizing fasciitis patient states that they are more chronic in nature and secondary to him not being on his Remicade.  We will give a dose of ceftriaxone and Flagyl given this is what he is on with dermatology.  Suspect that these are multiple small abscesses rather than 20 years gangrene though based upon my assessment.  White count elevated but downtrending from a few days ago.  He is afebrile and otherwise well-appearing.  His CT scan does shows signs consistent with hidradenitis.  If with multiple thick-walled abscesses but he also did comment on some involvement of the soft tissue of the perineum.  There is also concern for some small  amount of free fluid.  Denies any recent accidents.  Could just be reactive from the inflammation?there is no evidence of fourniers gangrene at this time but I did discuss with patient admission for IV antibiotics given does have involvement of the perineum to make sure that it is not progressing to something more severe.  Patient states that he cannot be admitted due to risk of losing his job he just got insurance about how he is going to get his Remicade restarted.  He states that this feels like his prior flareups and typically gets better with the Cipro and Flagyl prescribed by his dermatologist.  He understands however that if his symptoms are worsening he develops worsening pain, fevers that he is return immediately to the emergency room for admission but at this time he is adamant that he would prefer to go home.  I have given him 1 dose of IV antibiotics with ceftriaxone and Flagyl.  I will place him on the antibiotics previously placed by dermatology.  He is going to call his dermatologist tomorrow to try to get a follow-up in 2 to 3 days for repeat evaluation.  Unfortunately they are in the [facility] system so I am unable to send them a message to help expedite this.  But at this time patient is adamant that he would like to go home with oral antibiotics.  CT scan was also concerning for possible UTI so urine culture was sent but the cefdinir would be able to cover this.  Patient not to drink or drive on the oxycodone.       ____________________________________________   FINAL CLINICAL IMPRESSION(S) / ED DIAGNOSES   Final diagnoses:  Hidradenitis suppurativa      MEDICATIONS GIVEN DURING THIS VISIT:  Medications  cefTRIAXone (ROCEPHIN) 1 g in sodium chloride 0.9 % 100 mL IVPB (has no administration in time range)  metroNIDAZOLE (FLAGYL) IVPB 500 mg (has no administration in time range)     ED Discharge Orders          Ordered    metroNIDAZOLE (FLAGYL) 500 MG tablet  3 times daily         10/22/20 1646    cefdinir (OMNICEF) 300 MG capsule  2 times daily        10/22/20 1646    oxyCODONE (ROXICODONE) 5 MG immediate release tablet  Every 8 hours PRN        10/22/20 1646  Note:  This document was prepared using Dragon voice recognition software and may include unintentional dictation errors.    Concha Se, MD 10/22/20 1648    Concha Se, MD 10/22/20 [phone]

## 2020-10-22 NOTE — ED Triage Notes (Signed)
Pt presents to the [facility] with c/o abscess to buttock and groin that has progressively worsened over the past 2 weeks. Pt reports hx of Hidradenitis suppurativa.

## 2020-10-22 NOTE — Discharge Instructions (Addendum)
We discussed admission for IV antibiotics but at this time he would like to leave and take the oral antibiotics.  You should call your dermatologist and try to get follow-up the next 2 to 3 days given this is pretty severe hydradenitis suppurative and needs very close follow-up to make sure this is not progressing to something more serious that could require surgery.  If you develop fevers, worsening pain or worsening symptoms you need to return to the ER immediately for repeat evaluation   IMPRESSION: 1. Markedly thickened subcutaneous soft tissues throughout the gluteal region bilaterally with numerous thick walled abscesses, slightly progressive in appearance compared to prior study. There is also involvement of the soft tissues of the perineum. Findings are in keeping with reported history of hidradenitis suppurativa. 2. Prominent scrotal wall thickening and edema with large complex-appearing bilateral hydroceles. No gas is seen within the wall of the scrotum or perineum to suggest Fournier's gangrene. 3. Mild circumferential wall thickening of the urinary bladder, which may represent cystitis. Correlate with urinalysis. 4. Small amount of free fluid within the dependent portion of the pelvis. No organized fluid collection within the abdomen or pelvis.   Aortic Atherosclerosis (ICD10-I70.0).  Take oxycodone as prescribed. Do not drink alcohol, drive or participate in any other potentially dangerous activities while taking this medication as it may make you sleepy. Do not take this medication with any other sedating medications, either prescription or over-the-counter. If you were prescribed Percocet or Vicodin, do not take these with acetaminophen (Tylenol) as it is already contained within these medications.  This medication is an opiate (or narcotic) pain medication and can be habit forming. Use it as little as possible to achieve adequate pain control. Do not use or use it with extreme  caution if you have a history of opiate abuse or dependence. If you are on a pain contract with your primary care doctor or a pain specialist, be sure to let them know you were prescribed this medication today from the Emergency Department. This medication is intended for your use only - do not give any to anyone else and keep it in a secure place where nobody else, especially children, have access to it.

## 2020-10-28 ENCOUNTER — Other Ambulatory Visit: Payer: Self-pay

## 2020-10-28 ENCOUNTER — Emergency Department
Admission: EM | Admit: 2020-10-28 | Discharge: 2020-10-28 | Disposition: A | Discharge status: Home / Self Care | Payer: BC Managed Care – PPO | Attending: Student in an Organized Health Care Education/Training Program | Admitting: Student in an Organized Health Care Education/Training Program

## 2020-10-28 ENCOUNTER — Emergency Department: Payer: BC Managed Care – PPO

## 2020-10-28 DIAGNOSIS — Z20822 Contact with and (suspected) exposure to covid-19: Secondary | ICD-10-CM | POA: Diagnosis not present

## 2020-10-28 DIAGNOSIS — F17210 Nicotine dependence, cigarettes, uncomplicated: Secondary | ICD-10-CM | POA: Insufficient documentation

## 2020-10-28 DIAGNOSIS — R0781 Pleurodynia: Secondary | ICD-10-CM | POA: Insufficient documentation

## 2020-10-28 DIAGNOSIS — R0602 Shortness of breath: Secondary | ICD-10-CM | POA: Diagnosis not present

## 2020-10-28 DIAGNOSIS — R0789 Other chest pain: Secondary | ICD-10-CM | POA: Diagnosis not present

## 2020-10-28 DIAGNOSIS — R079 Chest pain, unspecified: Secondary | ICD-10-CM | POA: Diagnosis not present

## 2020-10-28 LAB — BASIC METABOLIC PANEL
Anion gap: 7 (ref 5–15)
BUN: 10 mg/dL (ref 6–20)
CO2: 26 mmol/L (ref 22–32)
Calcium: 8.7 mg/dL — ABNORMAL LOW (ref 8.9–10.3)
Chloride: 103 mmol/L (ref 98–111)
Creatinine, Ser: 0.96 mg/dL (ref 0.61–1.24)
GFR, Estimated: >60 mL/min (ref >60)
Glucose, Bld: 125 mg/dL — ABNORMAL HIGH (ref 70–99)
Potassium: 3.8 mmol/L (ref 3.5–5.1)
Sodium: 136 mmol/L (ref 135–145)

## 2020-10-28 LAB — TROPONIN I (HIGH SENSITIVITY)
Troponin I (High Sensitivity): 5 ng/L (ref <18)
Troponin I (High Sensitivity): 5 ng/L (ref <18)

## 2020-10-28 LAB — CBC
HCT: 34.5 % — ABNORMAL LOW (ref 39.0–52.0)
Hemoglobin: 11.6 g/dL — ABNORMAL LOW (ref 13.0–17.0)
MCH: 27.3 pg (ref 26.0–34.0)
MCHC: 33.6 g/dL (ref 30.0–36.0)
MCV: 81.2 fL (ref 80.0–100.0)
Platelets: 502 10*3/uL — ABNORMAL HIGH (ref 150–400)
RBC: 4.25 MIL/uL (ref 4.22–5.81)
RDW: 17 % — ABNORMAL HIGH (ref 11.5–15.5)
WBC: 16 10*3/uL — ABNORMAL HIGH (ref 4.0–10.5)
nRBC: 0 % (ref 0.0–0.2)

## 2020-10-28 LAB — RESP PANEL BY RT-PCR (FLU A&B, COVID) ARPGX2
Influenza A by PCR: NEGATIVE
Influenza B by PCR: NEGATIVE
SARS Coronavirus 2 by RT PCR: NEGATIVE

## 2020-10-28 LAB — D-DIMER, QUANTITATIVE: D-Dimer, Quant: 0.71 ug/mL-FEU — ABNORMAL HIGH (ref 0.00–0.50)

## 2020-10-28 MED ORDER — OXYCODONE-ACETAMINOPHEN 5-325 MG PO TABS: 1.0000 | ORAL_TABLET | Freq: Four times a day (QID) | ORAL | 0 refills | Status: DC | PRN

## 2020-10-28 MED ORDER — OXYCODONE-ACETAMINOPHEN 5-325 MG PO TABS
1.0000 | ORAL_TABLET | Freq: Once | ORAL | Status: AC
  Administered 2020-10-28: 1 via ORAL
  Filled 2020-10-28: qty 1

## 2020-10-28 MED ORDER — ALBUTEROL SULFATE HFA 108 (90 BASE) MCG/ACT IN AERS: 2.0000 | INHALATION_SPRAY | Freq: Four times a day (QID) | RESPIRATORY_TRACT | 2 refills | Status: DC | PRN

## 2020-10-28 MED ORDER — IOHEXOL 350 MG/ML SOLN
100.0000 mL | Freq: Once | INTRAVENOUS | Status: AC | PRN
  Administered 2020-10-28: 100 mL via INTRAVENOUS

## 2020-10-28 NOTE — ED Notes (Signed)
Patient complains of L sided non-radiating chest pain for "a few days". Pt states that pain began as dull and has gotten consistently sharp over the past 2 days. Pt denies any SHOB or edema. VSS.

## 2020-10-28 NOTE — ED Provider Notes (Signed)
[facility] Emergency Department Provider Note    Event Date/Time   First MD Initiated Contact with Patient 10/28/20 1910     (approximate)  I have reviewed the triage vital signs and the nursing notes.   HISTORY  Chief Complaint Chest Pain    HPI Benjamin Owens is a 50 y.o. male below listed past medical history presents to the ER for evaluation of left anterior chest pain associated with shortness of breath and discomfort when taking deep inspiration.  On going for more than a day.  No particular alleviating factors.  Worse with palpation too. No history of DVT or PE.  Feels like similar symptoms when he was treated for pneumonia 1 year ago.  Does smoke.  No recent steroids.  Is on antibiotics for hidradenitis.  Past Medical History:  Diagnosis Date   Hay fever    Hidradenitis suppurativa    Family History  Problem Relation Age of Onset   Stroke Mother    Heart disease Mother        Degenerative heart disease   Diabetes Mother    Throat cancer Father    Lung cancer Maternal Aunt    Breast cancer Paternal Aunt    Lung cancer Paternal Uncle        smoker   Healthy Maternal Grandmother    Heart attack Maternal Grandfather    Breast cancer Paternal Grandmother    Heart attack Paternal Grandfather    Healthy Cousin    Prostate cancer Paternal Uncle    Breast cancer Maternal Aunt    Past Surgical History:  Procedure Laterality Date   INCISION AND DRAINAGE ABSCESS Right 09/13/2019   Procedure: INCISION AND DRAINAGE ABSCESS;  Surgeon: Riki Altes, MD;  Location: [facility] ORS;  Service: Urology;  Laterality: Right;   NO PAST SURGERIES     Patient Active Problem List   Diagnosis Date Noted   Polyarthralgia 11/10/2019   Abscess of scrotal wall 09/13/2019   Alcohol dependence (HCC) 09/13/2019   Chest pain 03/29/2018   Nicotine dependence, uncomplicated 11/05/2015   Hidradenitis suppurativa 01/17/2013      Prior to Admission medications    Medication Sig Start Date End Date Taking? Authorizing Provider  albuterol (VENTOLIN HFA) 108 (90 Base) MCG/ACT inhaler Inhale 2 puffs into the lungs every 6 (six) hours as needed for wheezing or shortness of breath. 10/28/20  Yes Willy Eddy, MD  cefdinir (OMNICEF) 300 MG capsule Take 1 capsule (300 mg total) by mouth 2 (two) times daily for 10 days. 10/22/20 11/01/20  Concha Se, MD  folic acid (FOLVITE) 1 MG tablet Take 1 tablet (1 mg total) by mouth daily. Patient not taking: Reported on 11/17/2019 09/16/19   Lynn Ito, MD  ibuprofen (ADVIL) 200 MG tablet Take by mouth.    [provider]  inFLIXimab (REMICADE) 100 MG injection Inject into the vein. Patient not taking: Reported on 11/10/2019    [provider]  metroNIDAZOLE (FLAGYL) 500 MG tablet Take 1 tablet (500 mg total) by mouth 3 (three) times daily for 10 days. 10/22/20 11/01/20  Concha Se, MD  ondansetron (ZOFRAN) 4 MG tablet Take 1 tablet (4 mg total) by mouth every 8 (eight) hours as needed for up to 10 doses for nausea or vomiting. 09/27/20   Gilles Chiquito, MD  oxyCODONE-acetaminophen (PERCOCET) 5-325 MG tablet Take 1 tablet by mouth every 6 (six) hours as needed for up to 8 doses for severe pain. 10/28/20  Willy Eddy, MD  potassium chloride (K-DUR) 10 MEQ tablet Take 1 tablet (10 mEq total) by mouth 2 (two) times daily for 7 days. 04/20/18 04/27/18  Dionne Bucy, MD  thiamine 100 MG tablet Take 1 tablet (100 mg total) by mouth daily. Patient not taking: Reported on 11/10/2019 09/16/19   Lynn Ito, MD    Allergies Vancomycin and Azithromycin    Social History Social History   Tobacco Use   Smoking status: Every Day    Packs/day: 0.25    Types: Cigarettes   Smokeless tobacco: Never  Vaping Use   Vaping Use: Never used  Substance Use Topics   Alcohol use: Not Currently    Alcohol/week: 3.0 standard drinks    Types: 3 Cans of beer per week    Comment: weekdays 3 x 16 oz beers;  Weekend approx 2 cases over Sat/Sun.   Drug use: Yes    Types: Marijuana    Review of Systems Patient denies headaches, rhinorrhea, blurry vision, numbness, shortness of breath, chest pain, edema, cough, abdominal pain, nausea, vomiting, diarrhea, dysuria, fevers, rashes or hallucinations unless otherwise stated above in HPI. ____________________________________________   PHYSICAL EXAM:  VITAL SIGNS: Vitals:   10/28/20 2149 10/28/20 2157  BP:  110/75  Pulse: 72 67  Resp: 14 16  Temp:    SpO2: 100% 98%    Constitutional: Alert and oriented.  Eyes: Conjunctivae are normal.  Head: Atraumatic. Nose: No congestion/rhinnorhea. Mouth/Throat: Mucous membranes are moist.   Neck: No stridor. Painless ROM.  Cardiovascular: Normal rate, regular rhythm. Grossly normal heart sounds.  Good peripheral circulation. Respiratory: Normal respiratory effort.  No retractions. Lungs CTAB. Gastrointestinal: Soft and nontender. No distention. No abdominal bruits. No CVA tenderness. Genitourinary:  Musculoskeletal: No lower extremity tenderness nor edema.  No joint effusions. Neurologic:  Normal speech and language. No gross focal neurologic deficits are appreciated. No facial droop Skin:  Skin is warm, dry and intact. No rash noted. Psychiatric: Mood and affect are normal. Speech and behavior are normal.  ____________________________________________   LABS (all labs ordered are listed, but only abnormal results are displayed)  Results for orders placed or performed during the hospital encounter of 10/28/20 (from the past 24 hour(s))  Basic metabolic panel     Status: Abnormal   Collection Time: 10/28/20  3:32 PM  Result Value Ref Range   Sodium 136 135 - 145 mmol/L   Potassium 3.8 3.5 - 5.1 mmol/L   Chloride 103 98 - 111 mmol/L   CO2 26 22 - 32 mmol/L   Glucose, Bld 125 (H) 70 - 99 mg/dL   BUN 10 6 - 20 mg/dL   Creatinine, Ser 7.65 0.61 - 1.24 mg/dL   Calcium 8.7 (L) 8.9 - 10.3 mg/dL    GFR, Estimated >46 >50 mL/min   Anion gap 7 5 - 15  CBC     Status: Abnormal   Collection Time: 10/28/20  3:32 PM  Result Value Ref Range   WBC 16.0 (H) 4.0 - 10.5 K/uL   RBC 4.25 4.22 - 5.81 MIL/uL   Hemoglobin 11.6 (L) 13.0 - 17.0 g/dL   HCT 35.4 (L) 65.6 - 81.2 %   MCV 81.2 80.0 - 100.0 fL   MCH 27.3 26.0 - 34.0 pg   MCHC 33.6 30.0 - 36.0 g/dL   RDW 75.1 (H) 70.0 - 17.4 %   Platelets 502 (H) 150 - 400 K/uL   nRBC 0.0 0.0 - 0.2 %  Troponin I (High Sensitivity)  Status: None   Collection Time: 10/28/20  3:32 PM  Result Value Ref Range   Troponin I (High Sensitivity) 5 <18 ng/L  Troponin I (High Sensitivity)     Status: None   Collection Time: 10/28/20  7:41 PM  Result Value Ref Range   Troponin I (High Sensitivity) 5 <18 ng/L  D-dimer, quantitative     Status: Abnormal   Collection Time: 10/28/20  7:41 PM  Result Value Ref Range   D-Dimer, Quant 0.71 (H) 0.00 - 0.50 ug/mL-FEU  Resp Panel by RT-PCR (Flu A&B, Covid) Nasopharyngeal Swab     Status: None   Collection Time: 10/28/20  7:41 PM   Specimen: Nasopharyngeal Swab; Nasopharyngeal(NP) swabs in vial transport medium  Result Value Ref Range   SARS Coronavirus 2 by RT PCR NEGATIVE NEGATIVE   Influenza A by PCR NEGATIVE NEGATIVE   Influenza B by PCR NEGATIVE NEGATIVE   ____________________________________________  EKG My review and personal interpretation at Time: 15:19   Indication: chestp ain  Rate: 80  Rhythm: sinus Axis: normal Other: normal intervals ____________________________________________  RADIOLOGY  I personally reviewed all radiographic images ordered to evaluate for the above acute complaints and reviewed radiology reports and findings.  These findings were personally discussed with the patient.  Please see medical record for radiology report.  ____________________________________________   PROCEDURES  Procedure(s) performed:  Procedures    Critical Care performed:  no ____________________________________________   INITIAL IMPRESSION / ASSESSMENT AND PLAN / ED COURSE  Pertinent labs & imaging results that were available during my care of the patient were reviewed by me and considered in my medical decision making (see chart for details).   DDX: ACS, pericarditis, esophagitis, boerhaaves, pe, dissection, pna, bronchitis, costochondritis   Benjamin Owens is a 50 y.o. who presents to the ED with presentation as described above.  Seem somewhat atypical for ACS.  Does have pleuritic pain.  Will order D-dimer for thorough stratify he is otherwise low risk by Wells criteria does not seem consistent with dissection.  No clear sign of pneumonia is already on antibiotics which should appropriately cover most [facility] pneumonia.  Will test for COVID.  Clinical Course as of 10/28/20 2203  Wynelle Link Oct 28, 2020  2035 D-dimer is elevated.  Will order CTA to further stratify for PE. [PR]  2144 Were negative.  Troponin negative.  Does not seem consistent with ACS.  Molik cytosis but currently on appropriate antibiotics.  On Seni evidence of PE or pneumonia via CTA.  Does appear stable and appropriate for outpatient follow-up and referral.  Patient demonstrates understanding and agreeable to plan. [PR]    Clinical Course User Index [PR] Willy Eddy, MD    The patient was evaluated in Emergency Department today for the symptoms described in the history of present illness. He/she was evaluated in the context of the global COVID-19 pandemic, which necessitated consideration that the patient might be at risk for infection with the SARS-CoV-2 virus that causes COVID-19. Institutional protocols and algorithms that pertain to the evaluation of patients at risk for COVID-19 are in a state of rapid change based on information released by regulatory bodies including the CDC and federal and state organizations. These policies and algorithms were followed during the patient's  care in the ED.  As part of my medical decision making, I reviewed the following data within the electronic MEDICAL RECORD NUMBER Nursing notes reviewed and incorporated, Labs reviewed, notes from prior ED visits and [place] Controlled Substance Database  ____________________________________________   FINAL CLINICAL IMPRESSION(S) / ED DIAGNOSES  Final diagnoses:  Atypical chest pain      NEW MEDICATIONS STARTED DURING THIS VISIT:  Discharge Medication List as of 10/28/2020  9:40 PM     START taking these medications   Details  albuterol (VENTOLIN HFA) 108 (90 Base) MCG/ACT inhaler Inhale 2 puffs into the lungs every 6 (six) hours as needed for wheezing or shortness of breath., Starting Sun 10/28/2020, Normal         Note:  This document was prepared using Dragon voice recognition software and may include unintentional dictation errors.    Willy Eddyobinson, Natali Lavallee, MD 10/28/20 2204

## 2020-10-28 NOTE — ED Triage Notes (Signed)
Pt c/o L sided CP since yesterday. Denies SOB, N&V. States cough. Denies producing anything with cough.

## 2020-11-06 DIAGNOSIS — Z79899 Other long term (current) drug therapy: Secondary | ICD-10-CM | POA: Diagnosis not present

## 2020-11-21 ENCOUNTER — Ambulatory Visit (INDEPENDENT_AMBULATORY_CARE_PROVIDER_SITE_OTHER): Payer: BC Managed Care – PPO | Admitting: Internal Medicine

## 2020-11-21 ENCOUNTER — Other Ambulatory Visit: Payer: Self-pay

## 2020-11-21 ENCOUNTER — Ambulatory Visit: Payer: Self-pay | Admitting: *Deleted

## 2020-11-21 ENCOUNTER — Encounter: Payer: Self-pay | Admitting: Internal Medicine

## 2020-11-21 DIAGNOSIS — R531 Weakness: Secondary | ICD-10-CM | POA: Diagnosis not present

## 2020-11-21 DIAGNOSIS — R0602 Shortness of breath: Secondary | ICD-10-CM

## 2020-11-21 DIAGNOSIS — R42 Dizziness and giddiness: Secondary | ICD-10-CM | POA: Diagnosis not present

## 2020-11-21 DIAGNOSIS — U071 COVID-19: Secondary | ICD-10-CM | POA: Diagnosis not present

## 2020-11-21 MED ORDER — MECLIZINE HCL 25 MG PO TABS: 25.0000 mg | ORAL_TABLET | Freq: Three times a day (TID) | ORAL | 0 refills | Status: DC | PRN

## 2020-11-21 MED ORDER — ALBUTEROL SULFATE HFA 108 (90 BASE) MCG/ACT IN AERS: 2.0000 | INHALATION_SPRAY | Freq: Four times a day (QID) | RESPIRATORY_TRACT | 2 refills | Status: AC | PRN

## 2020-11-21 NOTE — Telephone Encounter (Signed)
+   COVID- out of work last week. Patient went back to work yesterday- dizzy, SOB, weakness. + COVID test-8/23 Reason for Disposition  [1] PERSISTING SYMPTOMS OF COVID-19 AND [2] NEW symptom AND [3] could be serious  Answer Assessment - Initial Assessment Questions 1. COVID-19 ONSET: "When did the symptoms of COVID-19 first start?"     821 2. DIAGNOSIS CONFIRMATION: "How were you diagnosed?" (e.g., COVID-19 oral or nasal viral test; COVID-19 antibody test; doctor visit)     Home test+ COVID 8/23 3. MAIN SYMPTOM:  "What is your main concern or symptom right now?" (e.g., breathing difficulty, cough, fatigue. loss of smell)     Dizzy spells 4. SYMPTOM ONSET: "When did the  dizziness  start?"     Patient has had lightheadedness- but not dizzy spells 5. BETTER-SAME-WORSE: "Are you getting better, staying the same, or getting worse over the last 1 to 2 weeks?"     Thought better- until went to work 6. RECENT MEDICAL VISIT: "Have you been seen by a healthcare provider (doctor, NP, PA) for these persisting COVID-19 symptoms?" If Yes, ask: "When were you seen?" (e.g., date)     no 7. COUGH: "Do you have a cough?" If Yes, ask: "How bad is the cough?"       no 8. FEVER: "Do you have a fever?" If Yes, ask: "What is your temperature, how was it measured, and when did it start?"     Comes and goes- low grade- does not check 9. BREATHING DIFFICULTY: "Are you having any trouble breathing?" If Yes, ask: "How bad is your breathing?" (e.g., mild, moderate, severe)    - MILD: No SOB at rest, mild SOB with walking, speaks normally in sentences, can lie down, no retractions, pulse < 100.    - MODERATE: SOB at rest, SOB with minimal exertion and prefers to sit, cannot lie down flat, speaks in phrases, mild retractions, audible wheezing, pulse 100-120.    - SEVERE: Very SOB at rest, speaks in single words, struggling to breathe, sitting hunched forward, retractions, pulse > 120       mild 10. HIGH RISK DISEASE: "Do  you have any chronic medical problems?" (e.g., asthma, heart or lung disease, weak immune system, obesity, etc.)       Hydradenitis  11. VACCINE: "Have you gotten the COVID-19 vaccine?" If Yes, ask: "Which one, how many shots, when did you get it?"       Yes- Pfizer  12. BOOSTER: "Have you received your COVID-19 booster?" If Yes, ask: "Which one and when did you get it?"       yes 13. PREGNANCY: "Is there any chance you are pregnant?" "When was your last menstrual period?"       N/a 14. OTHER SYMPTOMS: "Do you have any other symptoms?"  (e.g., fatigue, headache, muscle pain, weakness)       weakness 15. O2 SATURATION MONITOR:  "Do you use an oxygen saturation monitor (pulse oximeter) at home?" If Yes, ask "What is your reading (oxygen level) today?" "What is your usual oxygen saturation reading?" (e.g., 95%)       no  Protocols used: Coronavirus (COVID-19) Persisting Symptoms Follow-up Call-[facility]

## 2020-11-21 NOTE — Progress Notes (Signed)
Virtual Visit via Telephone Note  I connected with Benjamin Owens on 11/21/20 at 10:00 AM EDT by telephone and verified that I am speaking with the correct person using two identifiers.  Location: Patient: Home Provider: Office  Person's participating in this video call: Benjamin Owens and Benjamin Owens.   I discussed the limitations, risks, security and privacy concerns of performing an evaluation and management service by telephone and the availability of in person appointments. I also discussed with the patient that there may be a patient responsible charge related to this service. The patient expressed understanding and agreed to proceed.   History of Present Illness:  Pt reports he tested positive for Covid on 8/23. He tried to go back to work yesterday and experienced weakness, dizziness and shortness of breath. He currently denies headache, vision changes, runny nose, nasal congestion, ear pain, sore throat or cough. He did not take oral antiviral therapy. He has not taken any medications OTC for this.   Past Medical History:  Diagnosis Date   Hay fever    Hidradenitis suppurativa     Current Outpatient Medications  Medication Sig Dispense Refill   albuterol (VENTOLIN HFA) 108 (90 Base) MCG/ACT inhaler Inhale 2 puffs into the lungs every 6 (six) hours as needed for wheezing or shortness of breath. 8 g 2   folic acid (FOLVITE) 1 MG tablet Take 1 tablet (1 mg total) by mouth daily. (Patient not taking: Reported on 11/17/2019) 30 tablet 0   ibuprofen (ADVIL) 200 MG tablet Take by mouth.     inFLIXimab (REMICADE) 100 MG injection Inject into the vein. (Patient not taking: Reported on 11/10/2019)     ondansetron (ZOFRAN) 4 MG tablet Take 1 tablet (4 mg total) by mouth every 8 (eight) hours as needed for up to 10 doses for nausea or vomiting. 10 tablet 0   oxyCODONE-acetaminophen (PERCOCET) 5-325 MG tablet Take 1 tablet by mouth every 6 (six) hours as needed for up to 8 doses for  severe pain. 8 tablet 0   potassium chloride (K-DUR) 10 MEQ tablet Take 1 tablet (10 mEq total) by mouth 2 (two) times daily for 7 days. 14 tablet 0   thiamine 100 MG tablet Take 1 tablet (100 mg total) by mouth daily. (Patient not taking: Reported on 11/10/2019) 30 tablet 0   No current facility-administered medications for this visit.    Allergies  Allergen Reactions   Vancomycin Itching    Itching at IV site and redness (pt tolerated with IV benadryl)   Azithromycin Itching    Pt arm itching after receiving IV Zithromax    Family History  Problem Relation Age of Onset   Stroke Mother    Heart disease Mother        Degenerative heart disease   Diabetes Mother    Throat cancer Father    Lung cancer Maternal Aunt    Breast cancer Paternal Aunt    Lung cancer Paternal Uncle        smoker   Healthy Maternal Grandmother    Heart attack Maternal Grandfather    Breast cancer Paternal Grandmother    Heart attack Paternal Grandfather    Healthy Cousin    Prostate cancer Paternal Uncle    Breast cancer Maternal Aunt     Social History   Socioeconomic History   Marital status: Single    Spouse name: Not on file   Number of children: Not on file   Years of education: Not on  file   Highest education level: Not on file  Occupational History   Not on file  Tobacco Use   Smoking status: Every Day    Packs/day: 0.25    Types: Cigarettes   Smokeless tobacco: Never  Vaping Use   Vaping Use: Never used  Substance and Sexual Activity   Alcohol use: Not Currently    Alcohol/week: 3.0 standard drinks    Types: 3 Cans of beer per week    Comment: weekdays 3 x 16 oz beers; Weekend approx 2 cases over Sat/Sun.   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    Social Determinants of Health   Financial Resource Strain: Not on file  Food Insecurity: Not  on file  Transportation Needs: Not on file  Physical Activity: Not on file  Stress: Not on file  Social Connections: Not on file  Intimate Partner Violence: Not on file     Constitutional: Denies fever, malaise, fatigue, headache or abrupt weight changes.  HEENT: Denies eye pain, eye redness, ear pain, ringing in the ears, wax buildup, runny nose, nasal congestion, bloody nose, or sore throat. Respiratory: Pt reports shortness of breath. Denies difficulty breathing, cough or sputum production.   Cardiovascular: Denies chest pain, chest tightness, palpitations or swelling in the hands or feet.  Gastrointestinal: Denies abdominal pain, bloating, constipation, diarrhea or blood in the stool.  GU: Denies urgency, frequency, pain with urination, burning sensation, blood in urine, odor or discharge. Musculoskeletal: Pt reports weakness. Denies decrease in range of motion, difficulty with gait, muscle pain or joint pain and swelling.  Skin: Denies redness, rashes, lesions or ulcercations.  Neurological: Pt reports dizziness. Denies difficulty with memory, difficulty with speech or problems with balance and coordination.  Psych: Denies anxiety, depression, SI/HI.  No other specific complaints in a complete review of systems (except as listed in HPI above).    Observations/Objective:  Wt Readings from Last 3 Encounters:  10/28/20 170 lb (77.1 kg)  10/22/20 170 lb (77.1 kg)  09/27/20 158 lb 4.6 oz (71.8 kg)    General: In NAD. HEENT: Nose: no congestion noted Throat/Mouth: no hoarseness noted Pulmonary/Chest: Normal effort. No respiratory distress.  Neurological: Alert and oriented.   BMET    Component Value Date/Time   NA 136 10/28/2020 1532   NA 137 12/28/2017 1456   NA 139 10/04/2012 1820   K 3.8 10/28/2020 1532   K 4.3 10/04/2012 1820   CL 103 10/28/2020 1532   CL 105 10/04/2012 1820   CO2 26 10/28/2020 1532   CO2 31 10/04/2012 1820   GLUCOSE 125 (H) 10/28/2020 1532    GLUCOSE 87 10/04/2012 1820   BUN 10 10/28/2020 1532   BUN 8 12/28/2017 1456   BUN 12 10/04/2012 1820   CREATININE 0.96 10/28/2020 1532   CREATININE 0.92 04/27/2018 0927   CALCIUM 8.7 (L) 10/28/2020 1532   CALCIUM 9.2 10/04/2012 1820   GFRNONAA >60 10/28/2020 1532   GFRNONAA 98 04/27/2018 0927   GFRAA >60 10/06/2019 1343   GFRAA 114 04/27/2018 0927    Lipid Panel  No results found for: CHOL, TRIG, HDL, CHOLHDL, VLDL, LDLCALC  CBC    Component Value Date/Time   WBC 16.0 (H) 10/28/2020 1532   RBC 4.25 10/28/2020 1532   HGB 11.6 (L) 10/28/2020 1532   HGB 14.1 10/04/2012  1820   HCT 34.5 (L) 10/28/2020 1532   HCT 43.8 10/04/2012 1820   PLT 502 (H) 10/28/2020 1532   PLT 335 10/04/2012 1820   MCV 81.2 10/28/2020 1532   MCV 87 10/04/2012 1820   MCH 27.3 10/28/2020 1532   MCHC 33.6 10/28/2020 1532   RDW 17.0 (H) 10/28/2020 1532   RDW 14.9 (H) 10/04/2012 1820   LYMPHSABS 2.3 10/22/2020 1313   LYMPHSABS 1.4 10/04/2012 1820   MONOABS 1.0 10/22/2020 1313   MONOABS 1.5 (H) 10/04/2012 1820   EOSABS 0.1 10/22/2020 1313   EOSABS 0.1 10/04/2012 1820   BASOSABS 0.0 10/22/2020 1313   BASOSABS 0.1 10/04/2012 1820    Hgb A1C No results found for: HGBA1C      Assessment and Plan:  Generalized Weakness, Dizziness and Shortness of Breath secondary to Covid 19:  It is likely that he is deconditioned from being sick Encouraged rest and adequate water intake RX for Albuterol 1-2 puffs as needed RX for Meclinzine 25 mg TID prn Encouraged him to make position changes slowly Will give him a few more days off work for recovery  Return precautions discussed  Follow Up Instructions:    I discussed the assessment and treatment plan with the patient. The patient was provided an opportunity to ask questions and all were answered. The patient agreed with the plan and demonstrated an understanding of the instructions.   The patient was advised to call back or seek an in-person evaluation  if the symptoms worsen or if the condition fails to improve as anticipated.   Benjamin Owens

## 2020-11-21 NOTE — Telephone Encounter (Signed)
Patient states he had + COVID testing 8/23 at home. He went back to work yesterday and experienced dizziness, SOB and weakness. Patient advised that is not unexpected with some patient- depending on the severity of COVID virus. Patient advised he need virtual visit- no visit open at office today- will send message- he is advised office mat recommend virtual visit through MyChart- if they can not find work in for him.

## 2020-11-21 NOTE — Telephone Encounter (Signed)
The pt was scheduled for a virtual appt today.

## 2020-11-22 NOTE — Patient Instructions (Signed)

## 2020-12-04 DIAGNOSIS — Z79899 Other long term (current) drug therapy: Secondary | ICD-10-CM | POA: Diagnosis not present

## 2020-12-10 DIAGNOSIS — N5089 Other specified disorders of the male genital organs: Secondary | ICD-10-CM | POA: Diagnosis not present

## 2020-12-10 DIAGNOSIS — L910 Hypertrophic scar: Secondary | ICD-10-CM | POA: Diagnosis not present

## 2020-12-10 DIAGNOSIS — L732 Hidradenitis suppurativa: Secondary | ICD-10-CM | POA: Diagnosis not present

## 2020-12-10 DIAGNOSIS — N4889 Other specified disorders of penis: Secondary | ICD-10-CM | POA: Diagnosis not present

## 2020-12-27 ENCOUNTER — Ambulatory Visit: Payer: Self-pay | Admitting: *Deleted

## 2020-12-27 NOTE — Telephone Encounter (Signed)
Reason for Disposition  [1] SEVERE back pain (e.g., excruciating, unable to do any normal activities) AND [2] not improved 2 hours after pain medicine  Answer Assessment - Initial Assessment Questions 1. ONSET: "When did the pain begin?"      Yesterday 12/26/20 2. LOCATION: "Where does it hurt?" (upper, mid or lower back)     Lowback  and radiates to left hip  3. SEVERITY: "How bad is the pain?"  (e.g., Scale 1-10; mild, moderate, or severe)   - MILD (1-3): doesn't interfere with normal activities    - MODERATE (4-7): interferes with normal activities or awakens from sleep    - SEVERE (8-10): excruciating pain, unable to do any normal activities      C/o severe pain , can walk , difficulty bending  4. PATTERN: "Is the pain constant?" (e.g., yes, no; constant, intermittent)      Comes and goes with movt 5. RADIATION: "Does the pain shoot into your legs or elsewhere?"     Left hip and down to knee 6. CAUSE:  "What do you think is causing the back pain?"      Not sure  7. BACK OVERUSE:  "Any recent lifting of heavy objects, strenuous work or exercise?"     No  8. MEDICATIONS: "What have you taken so far for the pain?" (e.g., nothing, acetaminophen, NSAIDS)     Ibuprofen , OTC "Dones " pills  9. NEUROLOGIC SYMPTOMS: "Do you have any weakness, numbness, or problems with bowel/bladder control?"     Weakness lower extremities  10. OTHER SYMPTOMS: "Do you have any other symptoms?" (e.g., fever, abdominal pain, burning with urination, blood in urine)       Some abdominal pain  11. PREGNANCY: "Is there any chance you are pregnant?" (e.g., yes, no; LMP)       na  Protocols used: Back Pain-[facility]

## 2020-12-27 NOTE — Telephone Encounter (Signed)
C/o severe back pain low back to left hip started yesterday 12/26/20. C/o weakness in lower extremities . Can walk difficulty bending . Talking OTC medication for pain "Dones" pills and ibuprofen with minimal relief , as tried warm shower with "some"relief. Difficulty sleeping . Slept 4 hours . C/o this is the 2nd day out of work and would like to get a note for work. Appt scheduled for 12/28/20. Please advise if earlier appt today . Denies numbness / tingling in either leg. Care advise given. Patient verbalized understanding of care advise and to call back or go to [facility] or ED if symptoms worsen.

## 2020-12-28 ENCOUNTER — Ambulatory Visit (INDEPENDENT_AMBULATORY_CARE_PROVIDER_SITE_OTHER): Payer: BC Managed Care – PPO | Admitting: Internal Medicine

## 2020-12-28 ENCOUNTER — Encounter: Payer: Self-pay | Admitting: Internal Medicine

## 2020-12-28 ENCOUNTER — Other Ambulatory Visit: Payer: Self-pay

## 2020-12-28 VITALS — BP 102/65 | HR 85 | Temp 97.8°F | Resp 17 | Ht 72.0 in | Wt 158.0 lb

## 2020-12-28 DIAGNOSIS — M545 Low back pain, unspecified: Secondary | ICD-10-CM

## 2020-12-28 MED ORDER — CYCLOBENZAPRINE HCL 10 MG PO TABS: 10.0000 mg | ORAL_TABLET | Freq: Two times a day (BID) | ORAL | 0 refills | Status: AC | PRN

## 2020-12-28 NOTE — Progress Notes (Signed)
Subjective:    Patient ID: Benjamin Owens, male    DOB: May 29, 1970, 50 y.o.   MRN: [id number]  HPI  Pt presents to the clinic today with c/o left lower back pain. This started 3 days ago. He reports the pain started as more of an ache, then became sharp and stabbing. The pain radiates into his left leg. The pain is worse with movement. He denies numbness, tingling or weakness of his left lower extremity. He denies bowel or bladder complaints. He has not noticed a rash in this area. He denies any injury or prior back surgery. He has tried Dones and Ibuprofen OTC with minimal relief of symptoms.   Review of Systems     Past Medical History:  Diagnosis Date   Hay fever    Hidradenitis suppurativa     Current Outpatient Medications  Medication Sig Dispense Refill   albuterol (VENTOLIN HFA) 108 (90 Base) MCG/ACT inhaler Inhale 2 puffs into the lungs every 6 (six) hours as needed for wheezing or shortness of breath. 8 g 2   ibuprofen (ADVIL) 200 MG tablet Take by mouth.     meclizine (ANTIVERT) 25 MG tablet Take 1 tablet (25 mg total) by mouth 3 (three) times daily as needed for dizziness. 30 tablet 0   potassium chloride (K-DUR) 10 MEQ tablet Take 1 tablet (10 mEq total) by mouth 2 (two) times daily for 7 days. 14 tablet 0   No current facility-administered medications for this visit.    Allergies  Allergen Reactions   Vancomycin Itching    Itching at IV site and redness (pt tolerated with IV benadryl)   Azithromycin Itching    Pt arm itching after receiving IV Zithromax    Family History  Problem Relation Age of Onset   Stroke Mother    Heart disease Mother        Degenerative heart disease   Diabetes Mother    Throat cancer Father    Lung cancer Maternal Aunt    Breast cancer Paternal Aunt    Lung cancer Paternal Uncle        smoker   Healthy Maternal Grandmother    Heart attack Maternal Grandfather    Breast cancer Paternal Grandmother    Heart attack Paternal  Grandfather    Healthy Cousin    Prostate cancer Paternal Uncle    Breast cancer Maternal Aunt     Social History   Socioeconomic History   Marital status: Single    Spouse name: Not on file   Number of children: Not on file   Years of education: Not on file   Highest education level: Not on file  Occupational History   Not on file  Tobacco Use   Smoking status: Every Day    Packs/day: 0.25    Types: Cigarettes   Smokeless tobacco: Never  Vaping Use   Vaping Use: Never used  Substance and Sexual Activity   Alcohol use: Not Currently    Alcohol/week: 3.0 standard drinks    Types: 3 Cans of beer per week    Comment: weekdays 3 x 16 oz beers; Weekend approx 2 cases over Sat/Sun.   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    Social Determinants of Health   Financial Resource Strain: Not on file  Food Insecurity:  Not on file  Transportation Needs: Not on file  Physical Activity: Not on file  Stress: Not on file  Social Connections: Not on file  Intimate Partner Violence: Not on file     Constitutional: Denies fever, malaise, fatigue, headache or abrupt weight changes.  Respiratory: Denies difficulty breathing, shortness of breath, cough or sputum production.   Cardiovascular: Denies chest pain, chest tightness, palpitations or swelling in the hands or feet.  Gastrointestinal: Denies abdominal pain, bloating, constipation, diarrhea or blood in the stool.  GU: Denies urgency, frequency, pain with urination, burning sensation, blood in urine, odor or discharge. Musculoskeletal: Pt reports left sided low back pain. Denies decrease in range of motion, difficulty with gait, or joint swelling.  Skin: Denies redness, rashes, lesions or ulcercations.  Neurological: Denies numbness, tingling, weakness or problems with balance and coordination.     No other specific complaints in a complete review of systems (except as listed in HPI above).  Objective:   Physical Exam  BP 102/65 (BP Location: Right Arm, Patient Position: Sitting, Cuff Size: Normal)   Pulse 85   Temp 97.8 F (36.6 C) (Temporal)   Resp 17   Ht 6' (1.829 m)   Wt 158 lb (71.7 kg)   SpO2 100%   BMI 21.43 kg/m   Wt Readings from Last 3 Encounters:  10/28/20 170 lb (77.1 kg)  10/22/20 170 lb (77.1 kg)  09/27/20 158 lb 4.6 oz (71.8 kg)    General: Appears his stated age, well developed, well nourished in NAD. Skin: Warm, dry and intact. No rashes, lesions or ulcerations noted. Cardiovascular: Normal rate and rhythm. S1,S2 noted.  No murmur, rubs or gallops noted.  Pulmonary/Chest: Normal effort and positive vesicular breath sounds. No respiratory distress. No wheezes, rales or ronchi noted.  Musculoskeletal: Decreased flexion secondary to pain. Normal extension, rotation and lateral bending. No bony tenderness noted over the lumbar spine or left paralumbar muscles. Strength 5/5 BLE. He is walking hunched over secondary to pain. Neurological: Alert and oriented.  BMET    Component Value Date/Time   NA 136 10/28/2020 1532   NA 137 12/28/2017 1456   NA 139 10/04/2012 1820   K 3.8 10/28/2020 1532   K 4.3 10/04/2012 1820   CL 103 10/28/2020 1532   CL 105 10/04/2012 1820   CO2 26 10/28/2020 1532   CO2 31 10/04/2012 1820   GLUCOSE 125 (H) 10/28/2020 1532   GLUCOSE 87 10/04/2012 1820   BUN 10 10/28/2020 1532   BUN 8 12/28/2017 1456   BUN 12 10/04/2012 1820   CREATININE 0.96 10/28/2020 1532   CREATININE 0.92 04/27/2018 0927   CALCIUM 8.7 (L) 10/28/2020 1532   CALCIUM 9.2 10/04/2012 1820   GFRNONAA >60 10/28/2020 1532   GFRNONAA 98 04/27/2018 0927   GFRAA >60 10/06/2019 1343   GFRAA 114 04/27/2018 0927    Lipid Panel  No results found for: CHOL, TRIG, HDL, CHOLHDL, VLDL, LDLCALC  CBC    Component Value Date/Time   WBC 16.0 (H) 10/28/2020 1532    RBC 4.25 10/28/2020 1532   HGB 11.6 (L) 10/28/2020 1532   HGB 14.1 10/04/2012 1820   HCT 34.5 (L) 10/28/2020 1532   HCT 43.8 10/04/2012 1820   PLT 502 (H) 10/28/2020 1532   PLT 335 10/04/2012 1820   MCV 81.2 10/28/2020 1532   MCV 87 10/04/2012 1820   MCH 27.3 10/28/2020 1532   MCHC 33.6 10/28/2020 1532   RDW 17.0 (H) 10/28/2020 1532   RDW 14.9 (  H) 10/04/2012 1820   LYMPHSABS 2.3 10/22/2020 1313   LYMPHSABS 1.4 10/04/2012 1820   MONOABS 1.0 10/22/2020 1313   MONOABS 1.5 (H) 10/04/2012 1820   EOSABS 0.1 10/22/2020 1313   EOSABS 0.1 10/04/2012 1820   BASOSABS 0.0 10/22/2020 1313   BASOSABS 0.1 10/04/2012 1820    Hgb A1C No results found for: HGBA1C          Assessment & Plan:   Acute Left Sided Low Back Pain:  Seems muscular in origin Continue Ibuprofen 400 mg 2 x day for 5 days, consume with food RX for Flexeril 10 mg BID x 5 days- sedation caution given Encouraged heat and massage Back exercises given Will obtain xray if symptoms persist or worsen Work note provided  Return precautions discussed Nicki Reaper, NP This visit occurred during the SARS-CoV-2 public health emergency.  Safety protocols were in place, including screening questions prior to the visit, additional usage of staff PPE, and extensive cleaning of exam room while observing appropriate contact time as indicated for disinfecting solutions.

## 2020-12-28 NOTE — Patient Instructions (Signed)

## 2021-01-14 DIAGNOSIS — Z6821 Body mass index (BMI) 21.0-21.9, adult: Secondary | ICD-10-CM | POA: Diagnosis not present

## 2021-01-14 DIAGNOSIS — L732 Hidradenitis suppurativa: Secondary | ICD-10-CM | POA: Diagnosis not present

## 2021-04-19 DIAGNOSIS — L732 Hidradenitis suppurativa: Secondary | ICD-10-CM | POA: Diagnosis not present

## 2021-04-19 DIAGNOSIS — L02214 Cutaneous abscess of groin: Secondary | ICD-10-CM | POA: Diagnosis not present

## 2021-04-19 DIAGNOSIS — F1299 Cannabis use, unspecified with unspecified cannabis-induced disorder: Secondary | ICD-10-CM | POA: Diagnosis not present

## 2021-04-19 DIAGNOSIS — N492 Inflammatory disorders of scrotum: Secondary | ICD-10-CM | POA: Diagnosis not present

## 2021-04-19 DIAGNOSIS — M799 Soft tissue disorder, unspecified: Secondary | ICD-10-CM | POA: Diagnosis not present

## 2021-04-19 DIAGNOSIS — Z6820 Body mass index (BMI) 20.0-20.9, adult: Secondary | ICD-10-CM | POA: Diagnosis not present

## 2021-04-19 DIAGNOSIS — R3911 Hesitancy of micturition: Secondary | ICD-10-CM | POA: Diagnosis not present

## 2021-04-19 DIAGNOSIS — Z79899 Other long term (current) drug therapy: Secondary | ICD-10-CM | POA: Diagnosis not present

## 2021-04-19 DIAGNOSIS — F1090 Alcohol use, unspecified, uncomplicated: Secondary | ICD-10-CM | POA: Diagnosis not present

## 2021-04-19 DIAGNOSIS — N454 Abscess of epididymis or testis: Secondary | ICD-10-CM | POA: Diagnosis not present

## 2021-04-19 DIAGNOSIS — F17210 Nicotine dependence, cigarettes, uncomplicated: Secondary | ICD-10-CM | POA: Diagnosis not present

## 2021-04-19 DIAGNOSIS — R339 Retention of urine, unspecified: Secondary | ICD-10-CM | POA: Diagnosis not present

## 2021-05-02 DIAGNOSIS — L732 Hidradenitis suppurativa: Secondary | ICD-10-CM | POA: Diagnosis not present

## 2021-05-02 DIAGNOSIS — Z6821 Body mass index (BMI) 21.0-21.9, adult: Secondary | ICD-10-CM | POA: Diagnosis not present

## 2021-05-22 DIAGNOSIS — L732 Hidradenitis suppurativa: Secondary | ICD-10-CM | POA: Diagnosis not present

## 2021-06-15 DIAGNOSIS — L732 Hidradenitis suppurativa: Secondary | ICD-10-CM | POA: Diagnosis not present

## 2021-07-17 DIAGNOSIS — L732 Hidradenitis suppurativa: Secondary | ICD-10-CM | POA: Diagnosis not present

## 2021-07-19 DIAGNOSIS — L732 Hidradenitis suppurativa: Secondary | ICD-10-CM | POA: Diagnosis not present

## 2021-08-08 DIAGNOSIS — R059 Cough, unspecified: Secondary | ICD-10-CM | POA: Diagnosis not present

## 2021-08-08 DIAGNOSIS — U071 COVID-19: Secondary | ICD-10-CM | POA: Diagnosis not present

## 2021-08-10 IMAGING — CT CT HEAD W/O CM
3 series · 16 of 47 positions shown, 19 images · non-contrast
Comparison: None.

CLINICAL DATA: Headache.

EXAM:
CT HEAD WITHOUT CONTRAST
TECHNIQUE: Contiguous axial images were obtained from the base of the skull
through the vertex without intravenous contrast.

[Series 2: head wo · axial · 0.43mm/px · z∈[-141,-11]mm · 10 of 32 slices shown, 13 images]
[im 3/32  brain]
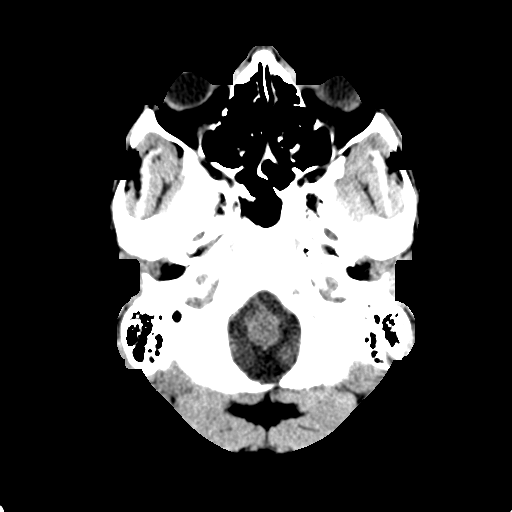
[im 3/32  bone]
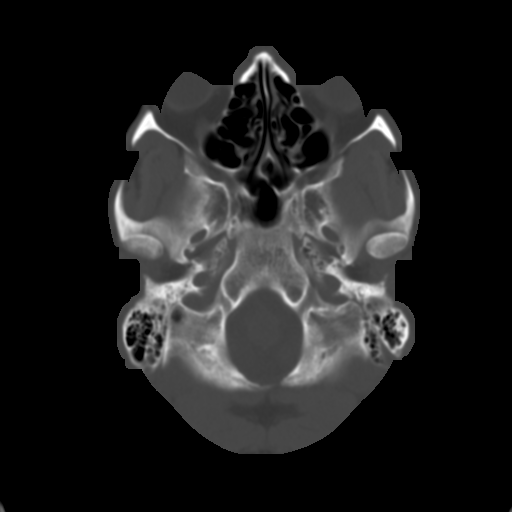
[im 6/32  brain]
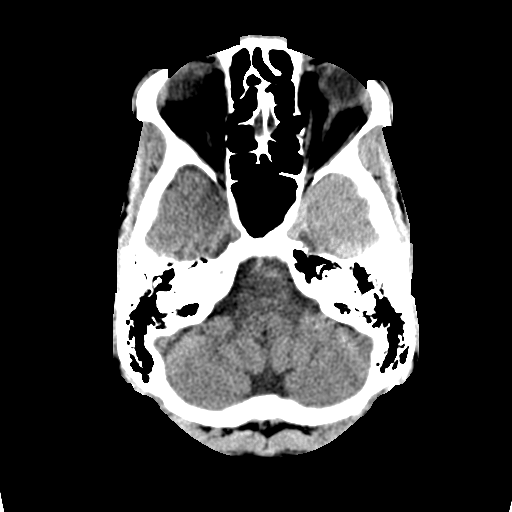
[im 9/32  brain]
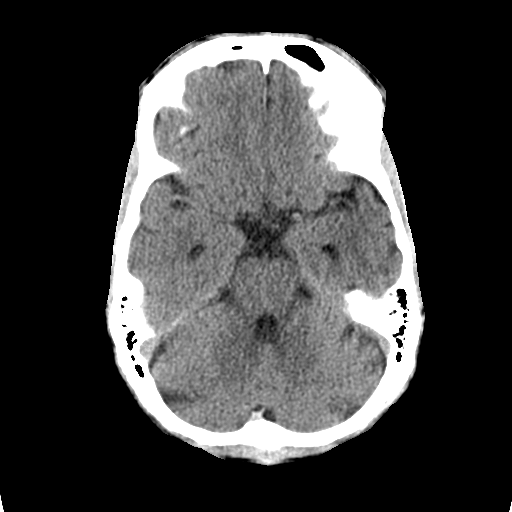
[im 11/32  brain]
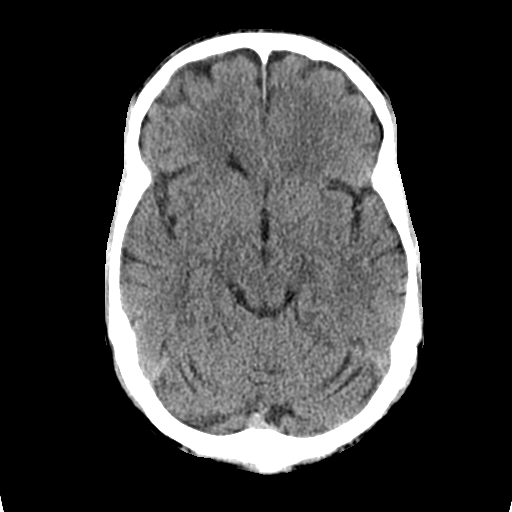
[im 14/32  brain]
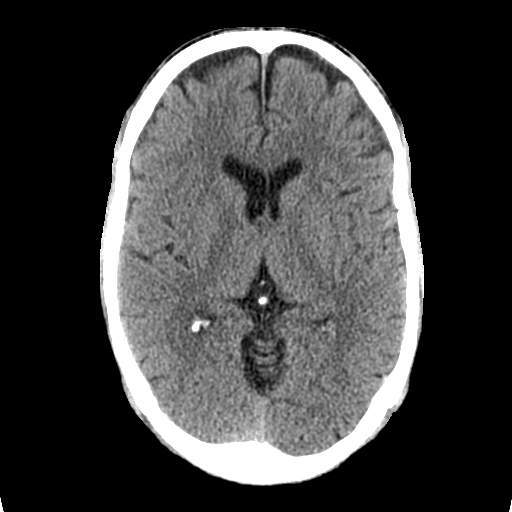
[im 14/32  bone]
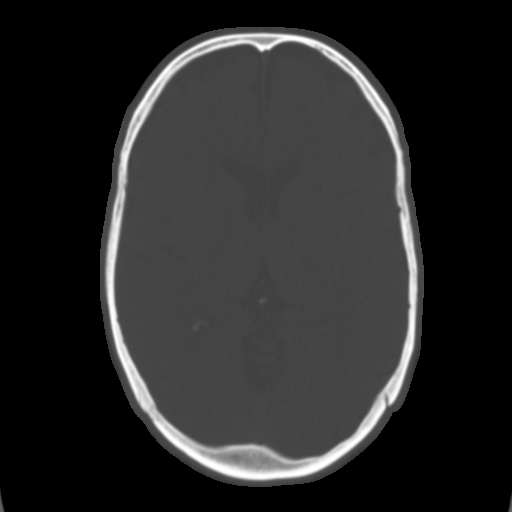
[im 18/32  brain]
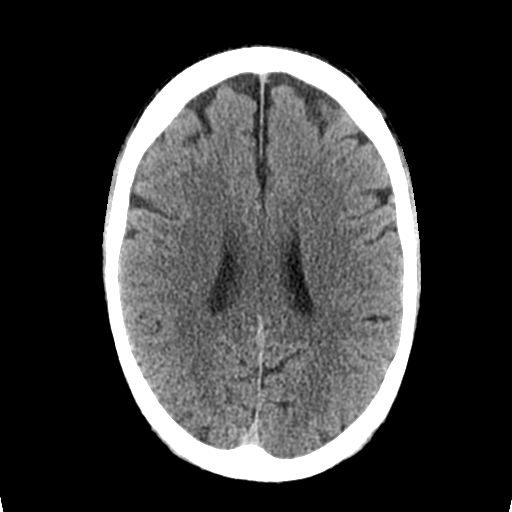
[im 21/32  brain]
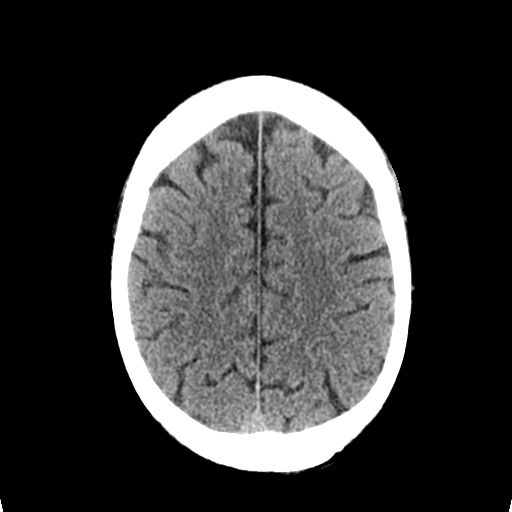
[im 24/32  brain]
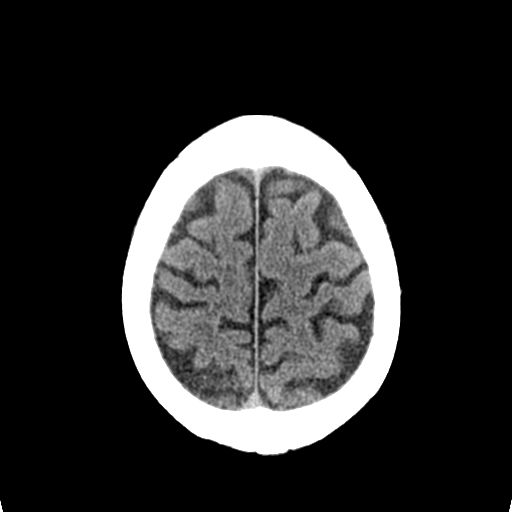
[im 26/32  brain]
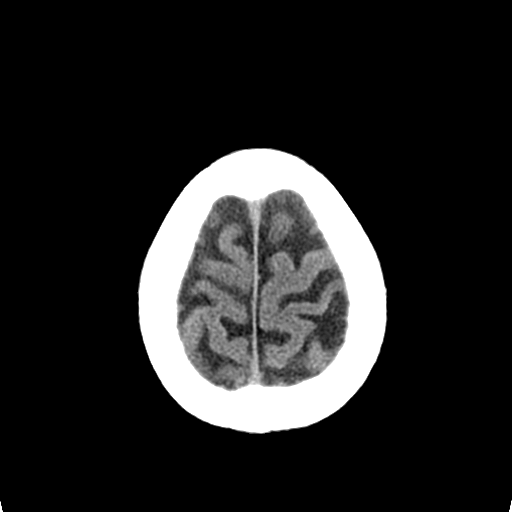
[im 26/32  bone]
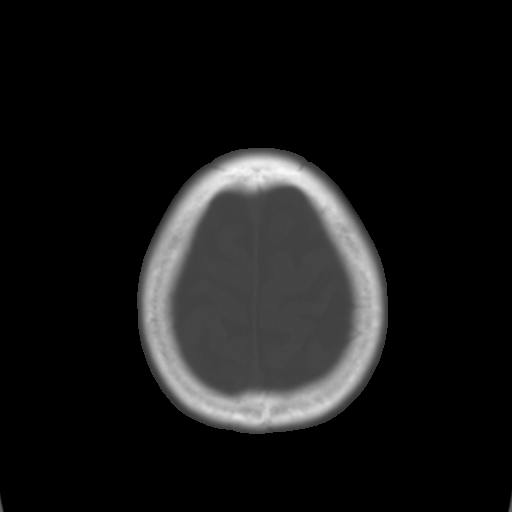
[im 29/32  brain]
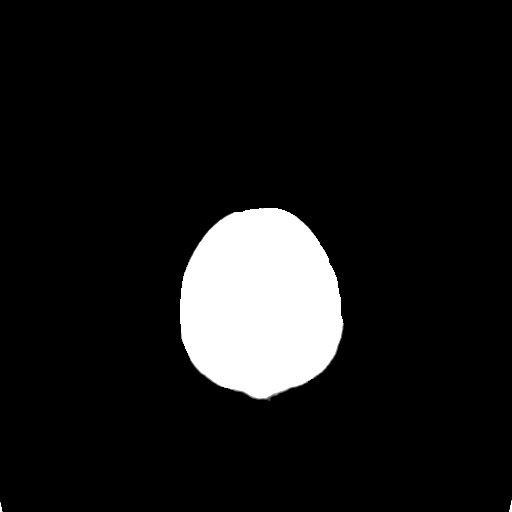

[Series 4: coronal soft tissue · coronal · 0.32mm/px · 3 of 69 slices shown]
[im 23/69  brain]
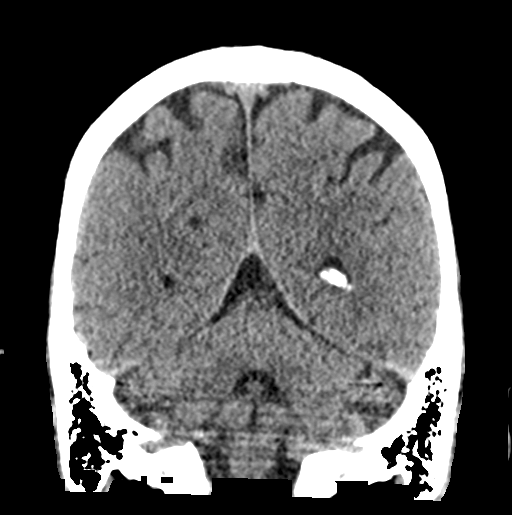
[im 31/69  brain]
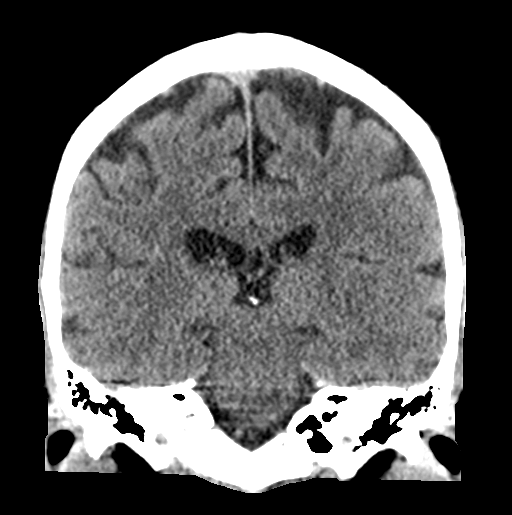
[im 38/69  brain]
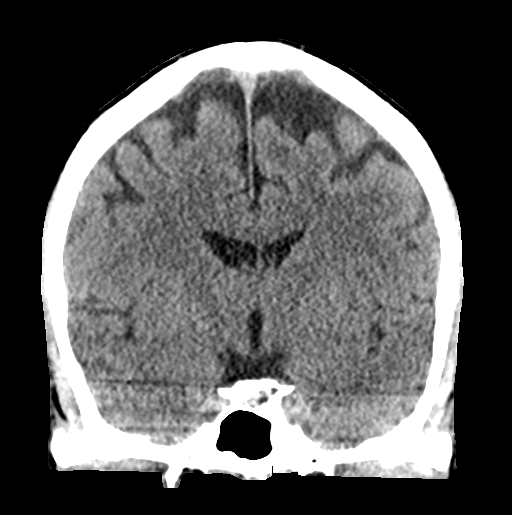

[Series 5: sagittal soft tissue · sagittal · 0.33mm/px · 3 of 56 slices shown]
[im 19/56  brain]
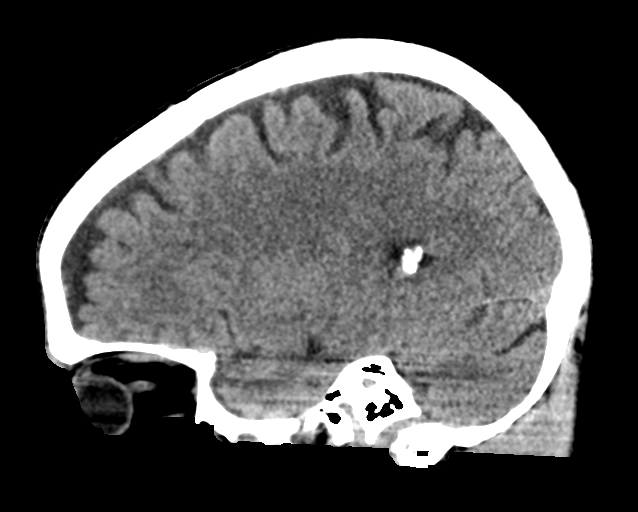
[im 28/56  brain]
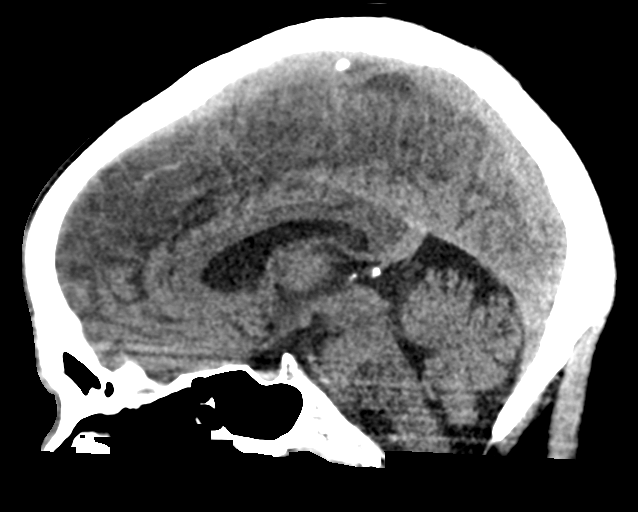
[im 37/56  brain]
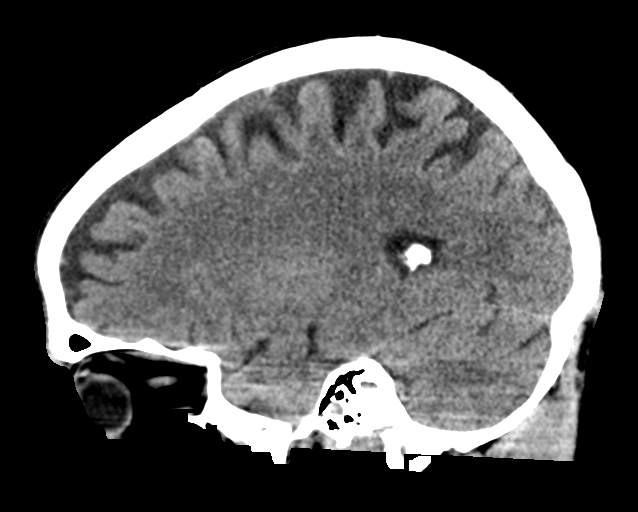

[16 of 47 positions shown; findings below may reference images not displayed]

FINDINGS: Brain: There is no evidence of an acute infarct, intracranial
hemorrhage, mass, midline shift, or extra-axial fluid collection.
The ventricles and sulci are within normal limits for age.

Vascular: Calcified atherosclerosis at the skull base. No hyperdense
vessel.

Skull: No fracture or suspicious osseous lesion.

Sinuses/Orbits: Visualized paranasal sinuses and mastoid air cells
are clear. Unremarkable orbits.

Other: None.
IMPRESSION: Unremarkable CT appearance of the brain.

## 2021-08-15 DIAGNOSIS — L732 Hidradenitis suppurativa: Secondary | ICD-10-CM | POA: Diagnosis not present

## 2021-08-17 DIAGNOSIS — L732 Hidradenitis suppurativa: Secondary | ICD-10-CM | POA: Diagnosis not present

## 2021-08-23 DIAGNOSIS — Z113 Encounter for screening for infections with a predominantly sexual mode of transmission: Secondary | ICD-10-CM | POA: Diagnosis not present

## 2021-09-21 DIAGNOSIS — L732 Hidradenitis suppurativa: Secondary | ICD-10-CM | POA: Diagnosis not present

## 2021-10-16 DIAGNOSIS — L732 Hidradenitis suppurativa: Secondary | ICD-10-CM | POA: Diagnosis not present

## 2021-10-19 DIAGNOSIS — L732 Hidradenitis suppurativa: Secondary | ICD-10-CM | POA: Diagnosis not present

## 2021-11-16 DIAGNOSIS — L732 Hidradenitis suppurativa: Secondary | ICD-10-CM | POA: Diagnosis not present

## 2021-11-19 DIAGNOSIS — L732 Hidradenitis suppurativa: Secondary | ICD-10-CM | POA: Diagnosis not present

## 2021-12-17 ENCOUNTER — Ambulatory Visit: Payer: Self-pay | Admitting: *Deleted

## 2021-12-17 NOTE — Telephone Encounter (Signed)
    Chief Complaint: "Almost fainted Sunday.Bent down and stood up." Symptoms: Above Frequency: Sunday Pertinent Negatives: Patient denies any other symptoms Disposition: [] ED /[] Urgent Care (no appt availability in office) / [x] Appointment(In office/virtual)/ []  [facility]/ [] Home Care/ [] Refused Recommended Disposition /[]  Mobile Bus/ []  Follow-up with PCP Additional Notes: Call back if symptoms return.  Answer Assessment - Initial Assessment Questions 1. ONSET: "How long were you unconscious?" (minutes) "When did it happen?"     Near syncope 2. CONTENT: "What happened during period of unconsciousness?" (e.g., seizure activity)      No loss of consciousness  3. MENTAL STATUS: "Alert and oriented now?" (oriented x 3 = name, month, location)      Alert 4. TRIGGER: "What do you think caused the fainting?" "What were you doing just before you fainted?"  (e.g., exercise, sudden standing up, prolonged standing)     Bent over 5. RECURRENT SYMPTOM: "Have you ever passed out before?" If Yes, ask: "When was the last time?" and "What happened that time?"      No 6. INJURY: "Did you sustain any injury during the fall?"      No 7. CARDIAC SYMPTOMS: "Have you had any of the following symptoms: chest pain, difficulty breathing, palpitations?"     No 8. NEUROLOGIC SYMPTOMS: "Have you had any of the following symptoms: headache, numbness, vertigo, weakness?"     No 9. GI SYMPTOMS: "Have you had any of the following symptoms: abdomen pain, vomiting, diarrhea, blood in stools?"     No 10. OTHER SYMPTOMS: "Do you have any other symptoms?"       No 11. PREGNANCY: "Is there any chance you are pregnant?" "When was your last menstrual period?"       N/A  Protocols used: Fainting-A-AH

## 2021-12-17 NOTE — Telephone Encounter (Signed)
Summary: feeling of passing out   Pt called to schedule an appt for feeling like he wants to pass out / he does not feel this way today or currently but it has happened occasionally / pt asked for appt for 10.2.23/ please advise if needed       Called patient to review sx of feeling like passing out. No answer. LVMTCB [phone].

## 2021-12-20 DIAGNOSIS — L732 Hidradenitis suppurativa: Secondary | ICD-10-CM | POA: Diagnosis not present

## 2021-12-23 ENCOUNTER — Encounter: Payer: Self-pay | Admitting: Internal Medicine

## 2021-12-23 ENCOUNTER — Ambulatory Visit (INDEPENDENT_AMBULATORY_CARE_PROVIDER_SITE_OTHER): Payer: BC Managed Care – PPO | Admitting: Internal Medicine

## 2021-12-23 VITALS — BP 106/62 | HR 93 | Temp 97.1°F | Ht 73.0 in | Wt 147.0 lb

## 2021-12-23 DIAGNOSIS — Z0001 Encounter for general adult medical examination with abnormal findings: Secondary | ICD-10-CM | POA: Diagnosis not present

## 2021-12-23 DIAGNOSIS — D75839 Thrombocytosis, unspecified: Secondary | ICD-10-CM | POA: Insufficient documentation

## 2021-12-23 DIAGNOSIS — Z125 Encounter for screening for malignant neoplasm of prostate: Secondary | ICD-10-CM | POA: Diagnosis not present

## 2021-12-23 DIAGNOSIS — R55 Syncope and collapse: Secondary | ICD-10-CM | POA: Diagnosis not present

## 2021-12-23 DIAGNOSIS — D649 Anemia, unspecified: Secondary | ICD-10-CM | POA: Insufficient documentation

## 2021-12-23 DIAGNOSIS — L732 Hidradenitis suppurativa: Secondary | ICD-10-CM | POA: Diagnosis not present

## 2021-12-23 DIAGNOSIS — R7303 Prediabetes: Secondary | ICD-10-CM | POA: Diagnosis not present

## 2021-12-23 NOTE — Assessment & Plan Note (Addendum)
Encouraged cessation.

## 2021-12-23 NOTE — Progress Notes (Signed)
Subjective:    Patient ID: Benjamin Owens, male    DOB: 08/05/1970, 51 y.o.   MRN: [id number]  HPI  Patient presents to clinic today for his annual exam.  Flu: 12/2017 Tetanus: > 10 years ago Pneumovax: 12/2017 COVID: [place] x2 Shingrix: Never PSA screening: never Colon screening: never Vision screening: as needed Dentist: as needed  Diet: He does eat meat. He consumes fruits and veggies. He does eat fried foods. He drinks mostly beer. Exercise: None   Review of Systems     Past Medical History:  Diagnosis Date   Hay fever    Hidradenitis suppurativa     Current Outpatient Medications  Medication Sig Dispense Refill   albuterol (VENTOLIN HFA) 108 (90 Base) MCG/ACT inhaler Inhale 2 puffs into the lungs every 6 (six) hours as needed for wheezing or shortness of breath. 8 g 2   cyclobenzaprine (FLEXERIL) 10 MG tablet Take 1 tablet (10 mg total) by mouth 2 (two) times daily as needed for muscle spasms. 15 tablet 0   ibuprofen (ADVIL) 200 MG tablet Take by mouth.     moxifloxacin (AVELOX) 400 MG tablet Take 400 mg by mouth daily.     No current facility-administered medications for this visit.    Allergies  Allergen Reactions   Vancomycin Itching    Itching at IV site and redness (pt tolerated with IV benadryl)   Azithromycin Itching    Pt arm itching after receiving IV Zithromax    Family History  Problem Relation Age of Onset   Stroke Mother    Heart disease Mother        Degenerative heart disease   Diabetes Mother    Throat cancer Father    Lung cancer Maternal Aunt    Breast cancer Paternal Aunt    Lung cancer Paternal Uncle        smoker   Healthy Maternal Grandmother    Heart attack Maternal Grandfather    Breast cancer Paternal Grandmother    Heart attack Paternal Grandfather    Healthy Cousin    Prostate cancer Paternal Uncle    Breast cancer Maternal Aunt     Social History   Socioeconomic History   Marital status: Single    Spouse  name: Not on file   Number of children: Not on file   Years of education: Not on file   Highest education level: Not on file  Occupational History   Not on file  Tobacco Use   Smoking status: Every Day    Packs/day: 0.25    Types: Cigarettes   Smokeless tobacco: Never  Vaping Use   Vaping Use: Never used  Substance and Sexual Activity   Alcohol use: Not Currently    Alcohol/week: 3.0 standard drinks of alcohol    Types: 3 Cans of beer per week    Comment: weekdays 3 x 16 oz beers; Weekend approx 2 cases over Sat/Sun.   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    Social Determinants of Health   Financial Resource Strain: Not on file  Food Insecurity: Not on file  Transportation Needs: Not on file  Physical Activity: Not on file  Stress: Not on file  Social Connections: Not on file  Intimate Partner Violence: Not on file     Constitutional: Denies fever,  malaise, fatigue, headache or abrupt weight changes.  HEENT: Denies eye pain, eye redness, ear pain, ringing in the ears, wax buildup, runny nose, nasal congestion, bloody nose, or sore throat. Respiratory: Denies difficulty breathing, shortness of breath, cough or sputum production.   Cardiovascular: Denies chest pain, chest tightness, palpitations or swelling in the hands or feet.  Gastrointestinal: Denies abdominal pain, bloating, constipation, diarrhea or blood in the stool.  GU: Denies urgency, frequency, pain with urination, burning sensation, blood in urine, odor or discharge. Musculoskeletal: Denies decrease in range of motion, difficulty with gait, muscle pain or joint pain and swelling.  Skin: Denies redness, rashes, lesions or ulcercations.  Neurological: Pt reports intermittent lightheadedness, near syncope. Denies dizziness, difficulty with memory, difficulty with speech or problems  with balance and coordination.  Psych: Denies anxiety, depression, SI/HI.  No other specific complaints in a complete review of systems (except as listed in HPI above).  Objective:   Physical Exam  BP 106/62 (BP Location: Left Arm, Patient Position: Sitting, Cuff Size: Normal)   Pulse 93   Temp (!) 97.1 F (36.2 C) (Temporal)   Ht 6' 1"  (1.854 m)   Wt 147 lb (66.7 kg)   SpO2 100%   BMI 19.39 kg/m   Wt Readings from Last 3 Encounters:  12/28/20 158 lb (71.7 kg)  10/28/20 170 lb (77.1 kg)  10/22/20 170 lb (77.1 kg)    General: Appears his stated age, well developed, well nourished in NAD. Skin: Warm, dry and intact.  HEENT: Head: normal shape and size; Eyes: sclera white, no icterus, conjunctiva pink, PERRLA and EOMs intact;  Neck:  Neck supple, trachea midline. No masses, lumps or thyromegaly present.  Cardiovascular: Normal rate and rhythm. S1,S2 noted.  No murmur, rubs or gallops noted. No JVD or BLE edema. No carotid bruits noted. Pulmonary/Chest: Normal effort and positive vesicular breath sounds. No respiratory distress. No wheezes, rales or ronchi noted.  Abdomen: Normal bowel sounds. Musculoskeletal: Strength 5/5 BUE/BLE.  No difficulty with gait.  Neurological: Alert and oriented. Cranial nerves II-XII grossly intact. Coordination normal.  Psychiatric: Mood and affect normal. Behavior is normal. Judgment and thought content normal.   BMET    Component Value Date/Time   NA 136 10/28/2020 1532   NA 137 12/28/2017 1456   NA 139 10/04/2012 1820   K 3.8 10/28/2020 1532   K 4.3 10/04/2012 1820   CL 103 10/28/2020 1532   CL 105 10/04/2012 1820   CO2 26 10/28/2020 1532   CO2 31 10/04/2012 1820   GLUCOSE 125 (H) 10/28/2020 1532   GLUCOSE 87 10/04/2012 1820   BUN 10 10/28/2020 1532   BUN 8 12/28/2017 1456   BUN 12 10/04/2012 1820   CREATININE 0.96 10/28/2020 1532   CREATININE 0.92 04/27/2018 0927   CALCIUM 8.7 (L) 10/28/2020 1532   CALCIUM 9.2 10/04/2012 1820    GFRNONAA >60 10/28/2020 1532   GFRNONAA 98 04/27/2018 0927   GFRAA >60 10/06/2019 1343   GFRAA 114 04/27/2018 0927    Lipid Panel  No results found for: "CHOL", "TRIG", "HDL", "CHOLHDL", "VLDL", "LDLCALC"  CBC    Component Value Date/Time   WBC 16.0 (H) 10/28/2020 1532   RBC 4.25 10/28/2020 1532   HGB 11.6 (L) 10/28/2020 1532   HGB 14.1 10/04/2012 1820   HCT 34.5 (L) 10/28/2020 1532   HCT 43.8 10/04/2012 1820   PLT 502 (H) 10/28/2020 1532   PLT 335 10/04/2012 1820   MCV 81.2 10/28/2020 1532   MCV 87  10/04/2012 1820   MCH 27.3 10/28/2020 1532   MCHC 33.6 10/28/2020 1532   RDW 17.0 (H) 10/28/2020 1532   RDW 14.9 (H) 10/04/2012 1820   LYMPHSABS 2.3 10/22/2020 1313   LYMPHSABS 1.4 10/04/2012 1820   MONOABS 1.0 10/22/2020 1313   MONOABS 1.5 (H) 10/04/2012 1820   EOSABS 0.1 10/22/2020 1313   EOSABS 0.1 10/04/2012 1820   BASOSABS 0.0 10/22/2020 1313   BASOSABS 0.1 10/04/2012 1820    Hgb A1C No results found for: "HGBA1C"         Assessment & Plan:   Preventative Health Maintenance:  He declines flu shot today He declines tetanus booster He should not need another Pneumovax until he is 53 Discussed Shingrix vaccine, will check coverage with his insurance company and schedule nurse visit if he would like to have this done He declines referral to GI for screening colonoscopy or Cologuard at this time Encouraged to consume a balanced diet and exercise regimen Advised him to see an eye doctor and dentist annually We will check CBC, c-Met, lipid, A1c  and PSA today  Near Syncope:  Orthostatics- he did have a drop but not a 10 pt drop Indication for ECG: Near syncope Interpretation of ECG: Normal rate and rhythm with intermittent PVCs Comparison of ECG: 10/2020, new PVC's. Encouraged him to push fluids and make position changes slowly Discussed use of midodrine but we will hold off on this now and see if he can improve with pushing fluids  RTC in 6 months, follow-up  chronic conditions Webb Silversmith, NP

## 2021-12-23 NOTE — Patient Instructions (Signed)
Syncope, Adult  Syncope is when you pass out or faint for a short time. It is caused by a sudden decrease in blood flow to the brain. This can happen for many reasons. It can sometimes happen when seeing blood, getting a shot (injection), or having pain or strong emotions. Most causes of fainting are not dangerous, but in some cases it can be a sign of a serious medical problem. If you faint, get help right away. Call your local emergency services (911 in the [place] Follow these instructions at home: Watch for any changes in your symptoms. Take these actions to stay safe and help with your symptoms: Knowing when you may be about to faint Signs that you may be about to faint include: Feeling dizzy or light-headed. It may feel like the room is spinning. Feeling weak. Feeling like you may vomit (nauseous). Seeing spots or seeing all white or all black. Having cold, clammy skin. Feeling warm and sweaty. Hearing ringing in the ears. If you start to feel like you might faint, sit or lie down right away. If sitting, lower your head down between your legs. If lying down, raise (elevate) your feet above the level of your heart. Breathe deeply and steadily. Wait until all of the symptoms are gone. Have someone stay with you until you feel better. Medicines Take over-the-counter and prescription medicines only as told by your doctor. If you are taking blood pressure or heart medicine, sit up and stand up slowly. Spend a few minutes getting ready to sit and then stand. This can help you feel less dizzy. Lifestyle Do not drive, use machinery, or play sports until your doctor says it is okay. Do not drink alcohol. Do not smoke or use any products that contain nicotine or tobacco. If you need help quitting, ask your doctor. Avoid hot tubs and saunas. General instructions Talk with your doctor about your symptoms. You may need to have testing to help find the cause. Drink enough fluid to keep your pee  (urine) pale yellow. Avoid standing for a long time. If you must stand for a long time, do movements such as: Moving your legs. Crossing your legs. Flexing and stretching your leg muscles. Squatting. Keep all follow-up visits. Contact a doctor if: You have episodes of near fainting. Get help right away if: You pass out or faint. You hit your head or are injured after fainting. You have any of these symptoms: Fast or uneven heartbeats (palpitations). Pain in your chest, belly, or back. Shortness of breath. You have jerky movements that you cannot control (seizure). You have a very bad headache. You are confused. You have problems with how you see (vision). You are very weak. You have trouble walking. You are bleeding from your mouth or your butt (rectum). You have black or tarry poop (stool). These symptoms may be an emergency. Get help right away. Call your local emergency services (911 in the [place] Do not wait to see if the symptoms will go away. Do not drive yourself to the hospital. Summary Syncope is when you pass out or faint for a short time. It is caused by a sudden decrease in blood flow to the brain. Signs that you may be about to faint include feeling dizzy or light-headed, feeling like you may vomit, seeing all white or all black, or having cold, clammy skin. If you start to feel like you might faint, sit or lie down right away. Lower your head if sitting, or raise (elevate)   your feet if lying down. Breathe deeply and steadily. Wait until all of the symptoms are gone. This information is not intended to replace advice given to you by your health care provider. Make sure you discuss any questions you have with your health care provider. Document Revised: 07/19/2020 Document Reviewed: 07/19/2020 Elsevier Patient Education  2023 [employer]  

## 2021-12-24 LAB — COMPLETE METABOLIC PANEL WITH GFR
AG Ratio: 0.5 (calc) — ABNORMAL LOW (ref 1.0–2.5)
ALT: 14 U/L (ref 9–46)
AST: 18 U/L (ref 10–35)
Albumin: 3.1 g/dL — ABNORMAL LOW (ref 3.6–5.1)
Alkaline phosphatase (APISO): 225 U/L — ABNORMAL HIGH (ref 35–144)
BUN: 13 mg/dL (ref 7–25)
CO2: 28 mmol/L (ref 20–32)
Calcium: 8.8 mg/dL (ref 8.6–10.3)
Chloride: 98 mmol/L (ref 98–110)
Creat: 1.15 mg/dL (ref 0.70–1.30)
Globulin: 6 g/dL (calc) — ABNORMAL HIGH (ref 1.9–3.7)
Glucose, Bld: 88 mg/dL (ref 65–99)
Potassium: 4.5 mmol/L (ref 3.5–5.3)
Sodium: 135 mmol/L (ref 135–146)
Total Bilirubin: 0.4 mg/dL (ref 0.2–1.2)
Total Protein: 9.1 g/dL — ABNORMAL HIGH (ref 6.1–8.1)
eGFR: 77 mL/min/{1.73_m2} (ref >=60)

## 2021-12-24 LAB — PSA: PSA: 0.76 ng/mL (ref <=4.00)

## 2021-12-24 LAB — LIPID PANEL
Cholesterol: 103 mg/dL (ref <200)
HDL: 30 mg/dL — ABNORMAL LOW (ref >=40)
LDL Cholesterol (Calc): 55 mg/dL (calc)
Non-HDL Cholesterol (Calc): 73 mg/dL (calc) (ref <130)
Total CHOL/HDL Ratio: 3.4 (calc) (ref <5.0)
Triglycerides: 99 mg/dL (ref <150)

## 2021-12-24 LAB — CBC
HCT: 32.3 % — ABNORMAL LOW (ref 38.5–50.0)
Hemoglobin: 10.5 g/dL — ABNORMAL LOW (ref 13.2–17.1)
MCH: 25.7 pg — ABNORMAL LOW (ref 27.0–33.0)
MCHC: 32.5 g/dL (ref 32.0–36.0)
MCV: 79.2 fL — ABNORMAL LOW (ref 80.0–100.0)
MPV: 9 fL (ref 7.5–12.5)
Platelets: 543 10*3/uL — ABNORMAL HIGH (ref 140–400)
RBC: 4.08 10*6/uL — ABNORMAL LOW (ref 4.20–5.80)
RDW: 17 % — ABNORMAL HIGH (ref 11.0–15.0)
WBC: 12.1 10*3/uL — ABNORMAL HIGH (ref 3.8–10.8)

## 2021-12-24 LAB — HEMOGLOBIN A1C
Hgb A1c MFr Bld: 5.4 % of total Hgb (ref <5.7)
Mean Plasma Glucose: 108 mg/dL
eAG (mmol/L): 6 mmol/L

## 2022-01-13 DIAGNOSIS — L732 Hidradenitis suppurativa: Secondary | ICD-10-CM | POA: Diagnosis not present

## 2022-01-19 DIAGNOSIS — L732 Hidradenitis suppurativa: Secondary | ICD-10-CM | POA: Diagnosis not present

## 2022-01-28 ENCOUNTER — Encounter: Payer: Self-pay | Admitting: Internal Medicine

## 2022-02-08 DIAGNOSIS — L732 Hidradenitis suppurativa: Secondary | ICD-10-CM | POA: Diagnosis not present

## 2022-02-11 DIAGNOSIS — L732 Hidradenitis suppurativa: Secondary | ICD-10-CM | POA: Diagnosis not present

## 2022-03-10 DIAGNOSIS — L732 Hidradenitis suppurativa: Secondary | ICD-10-CM | POA: Diagnosis not present

## 2022-03-13 DIAGNOSIS — L732 Hidradenitis suppurativa: Secondary | ICD-10-CM | POA: Diagnosis not present

## 2022-04-11 DIAGNOSIS — L732 Hidradenitis suppurativa: Secondary | ICD-10-CM | POA: Diagnosis not present

## 2022-04-13 DIAGNOSIS — L732 Hidradenitis suppurativa: Secondary | ICD-10-CM | POA: Diagnosis not present

## 2022-05-09 DIAGNOSIS — L732 Hidradenitis suppurativa: Secondary | ICD-10-CM | POA: Diagnosis not present

## 2022-05-09 DIAGNOSIS — K732 Chronic active hepatitis, not elsewhere classified: Secondary | ICD-10-CM | POA: Diagnosis not present

## 2022-06-06 DIAGNOSIS — L732 Hidradenitis suppurativa: Secondary | ICD-10-CM | POA: Diagnosis not present

## 2022-06-08 DIAGNOSIS — L732 Hidradenitis suppurativa: Secondary | ICD-10-CM | POA: Diagnosis not present

## 2022-06-09 DIAGNOSIS — Z79899 Other long term (current) drug therapy: Secondary | ICD-10-CM | POA: Diagnosis not present

## 2022-06-09 DIAGNOSIS — L732 Hidradenitis suppurativa: Secondary | ICD-10-CM | POA: Diagnosis not present

## 2022-06-26 DIAGNOSIS — J302 Other seasonal allergic rhinitis: Secondary | ICD-10-CM | POA: Diagnosis not present

## 2022-06-26 DIAGNOSIS — H6120 Impacted cerumen, unspecified ear: Secondary | ICD-10-CM | POA: Diagnosis not present

## 2022-07-04 DIAGNOSIS — L732 Hidradenitis suppurativa: Secondary | ICD-10-CM | POA: Diagnosis not present

## 2022-07-14 DIAGNOSIS — L732 Hidradenitis suppurativa: Secondary | ICD-10-CM | POA: Diagnosis not present

## 2022-08-06 DIAGNOSIS — L732 Hidradenitis suppurativa: Secondary | ICD-10-CM | POA: Diagnosis not present

## 2022-08-07 DIAGNOSIS — L732 Hidradenitis suppurativa: Secondary | ICD-10-CM | POA: Diagnosis not present

## 2022-09-03 DIAGNOSIS — L732 Hidradenitis suppurativa: Secondary | ICD-10-CM | POA: Diagnosis not present

## 2022-09-07 DIAGNOSIS — L732 Hidradenitis suppurativa: Secondary | ICD-10-CM | POA: Diagnosis not present

## 2022-10-08 DIAGNOSIS — L732 Hidradenitis suppurativa: Secondary | ICD-10-CM | POA: Diagnosis not present

## 2022-10-11 DIAGNOSIS — L732 Hidradenitis suppurativa: Secondary | ICD-10-CM | POA: Diagnosis not present

## 2022-11-06 DIAGNOSIS — L732 Hidradenitis suppurativa: Secondary | ICD-10-CM | POA: Diagnosis not present

## 2022-11-10 DIAGNOSIS — L732 Hidradenitis suppurativa: Secondary | ICD-10-CM | POA: Diagnosis not present

## 2022-12-04 DIAGNOSIS — L732 Hidradenitis suppurativa: Secondary | ICD-10-CM | POA: Diagnosis not present

## 2022-12-06 DIAGNOSIS — L732 Hidradenitis suppurativa: Secondary | ICD-10-CM | POA: Diagnosis not present

## 2023-01-04 DIAGNOSIS — L732 Hidradenitis suppurativa: Secondary | ICD-10-CM | POA: Diagnosis not present

## 2023-01-09 DIAGNOSIS — L732 Hidradenitis suppurativa: Secondary | ICD-10-CM | POA: Diagnosis not present

## 2023-02-01 DIAGNOSIS — L732 Hidradenitis suppurativa: Secondary | ICD-10-CM | POA: Diagnosis not present

## 2023-02-02 DIAGNOSIS — L910 Hypertrophic scar: Secondary | ICD-10-CM | POA: Diagnosis not present

## 2023-02-02 DIAGNOSIS — L732 Hidradenitis suppurativa: Secondary | ICD-10-CM | POA: Diagnosis not present

## 2023-02-02 DIAGNOSIS — Z79899 Other long term (current) drug therapy: Secondary | ICD-10-CM | POA: Diagnosis not present

## 2023-02-02 DIAGNOSIS — R197 Diarrhea, unspecified: Secondary | ICD-10-CM | POA: Diagnosis not present

## 2023-02-11 DIAGNOSIS — L732 Hidradenitis suppurativa: Secondary | ICD-10-CM | POA: Diagnosis not present

## 2023-03-11 DIAGNOSIS — L732 Hidradenitis suppurativa: Secondary | ICD-10-CM | POA: Diagnosis not present

## 2023-03-15 DIAGNOSIS — L732 Hidradenitis suppurativa: Secondary | ICD-10-CM | POA: Diagnosis not present

## 2023-03-23 DIAGNOSIS — L732 Hidradenitis suppurativa: Secondary | ICD-10-CM | POA: Diagnosis not present

## 2023-04-17 DIAGNOSIS — L732 Hidradenitis suppurativa: Secondary | ICD-10-CM | POA: Diagnosis not present

## 2023-05-16 DIAGNOSIS — L732 Hidradenitis suppurativa: Secondary | ICD-10-CM | POA: Diagnosis not present

## 2023-05-25 DIAGNOSIS — L732 Hidradenitis suppurativa: Secondary | ICD-10-CM | POA: Diagnosis not present

## 2023-06-13 DIAGNOSIS — L732 Hidradenitis suppurativa: Secondary | ICD-10-CM | POA: Diagnosis not present

## 2023-06-19 DIAGNOSIS — Z79899 Other long term (current) drug therapy: Secondary | ICD-10-CM | POA: Diagnosis not present

## 2023-07-11 DIAGNOSIS — L732 Hidradenitis suppurativa: Secondary | ICD-10-CM | POA: Diagnosis not present

## 2023-07-18 DIAGNOSIS — L732 Hidradenitis suppurativa: Secondary | ICD-10-CM | POA: Diagnosis not present

## 2023-08-03 DIAGNOSIS — L732 Hidradenitis suppurativa: Secondary | ICD-10-CM | POA: Diagnosis not present

## 2023-08-15 DIAGNOSIS — L732 Hidradenitis suppurativa: Secondary | ICD-10-CM | POA: Diagnosis not present

## 2023-09-19 DIAGNOSIS — L732 Hidradenitis suppurativa: Secondary | ICD-10-CM | POA: Diagnosis not present

## 2023-09-21 DIAGNOSIS — L732 Hidradenitis suppurativa: Secondary | ICD-10-CM | POA: Diagnosis not present

## 2023-10-17 DIAGNOSIS — L732 Hidradenitis suppurativa: Secondary | ICD-10-CM | POA: Diagnosis not present

## 2023-10-19 DIAGNOSIS — L732 Hidradenitis suppurativa: Secondary | ICD-10-CM | POA: Diagnosis not present

## 2023-11-14 DIAGNOSIS — L732 Hidradenitis suppurativa: Secondary | ICD-10-CM | POA: Diagnosis not present

## 2023-12-06 DIAGNOSIS — L732 Hidradenitis suppurativa: Secondary | ICD-10-CM | POA: Diagnosis not present

## 2024-01-03 DIAGNOSIS — L732 Hidradenitis suppurativa: Secondary | ICD-10-CM | POA: Diagnosis not present

## 2024-02-01 DIAGNOSIS — L732 Hidradenitis suppurativa: Secondary | ICD-10-CM | POA: Diagnosis not present

## 2024-02-14 DIAGNOSIS — L732 Hidradenitis suppurativa: Secondary | ICD-10-CM | POA: Diagnosis not present

## 2024-03-13 DIAGNOSIS — L732 Hidradenitis suppurativa: Secondary | ICD-10-CM | POA: Diagnosis not present
